# Patient Record
Sex: Male | Born: 1965 | Race: White | Hispanic: No | Marital: Married | Smoking: Current every day smoker
Health system: Southern US, Community
[De-identification: ages and names within clinical notes are randomized; demographics above are authoritative.]

## PROBLEM LIST (undated history)

## (undated) DIAGNOSIS — I2510 Atherosclerotic heart disease of native coronary artery without angina pectoris: Secondary | ICD-10-CM

## (undated) DIAGNOSIS — Z87442 Personal history of urinary calculi: Secondary | ICD-10-CM

## (undated) DIAGNOSIS — E785 Hyperlipidemia, unspecified: Secondary | ICD-10-CM

## (undated) DIAGNOSIS — F1410 Cocaine abuse, uncomplicated: Secondary | ICD-10-CM

## (undated) DIAGNOSIS — N289 Disorder of kidney and ureter, unspecified: Secondary | ICD-10-CM

## (undated) DIAGNOSIS — M1990 Unspecified osteoarthritis, unspecified site: Secondary | ICD-10-CM

## (undated) DIAGNOSIS — F17200 Nicotine dependence, unspecified, uncomplicated: Secondary | ICD-10-CM

## (undated) DIAGNOSIS — I214 Non-ST elevation (NSTEMI) myocardial infarction: Secondary | ICD-10-CM

## (undated) HISTORY — PX: HERNIA REPAIR: SHX51

## (undated) HISTORY — PX: CORONARY ANGIOPLASTY WITH STENT PLACEMENT: SHX49

---

## 2017-11-17 ENCOUNTER — Emergency Department (HOSPITAL_COMMUNITY): Payer: Self-pay

## 2017-11-17 ENCOUNTER — Encounter (HOSPITAL_COMMUNITY): Payer: Self-pay

## 2017-11-17 ENCOUNTER — Inpatient Hospital Stay (HOSPITAL_COMMUNITY)
Admission: EM | Admit: 2017-11-17 | Discharge: 2017-11-20 | DRG: 282 | Disposition: A | Discharge status: Home / Self Care | Payer: Self-pay | Attending: Cardiology | Admitting: Cardiology

## 2017-11-17 ENCOUNTER — Other Ambulatory Visit: Payer: Self-pay

## 2017-11-17 DIAGNOSIS — Z7151 Drug abuse counseling and surveillance of drug abuser: Secondary | ICD-10-CM

## 2017-11-17 DIAGNOSIS — Z955 Presence of coronary angioplasty implant and graft: Secondary | ICD-10-CM

## 2017-11-17 DIAGNOSIS — I214 Non-ST elevation (NSTEMI) myocardial infarction: Secondary | ICD-10-CM

## 2017-11-17 DIAGNOSIS — I10 Essential (primary) hypertension: Secondary | ICD-10-CM | POA: Diagnosis present

## 2017-11-17 DIAGNOSIS — I25110 Atherosclerotic heart disease of native coronary artery with unstable angina pectoris: Secondary | ICD-10-CM | POA: Diagnosis present

## 2017-11-17 DIAGNOSIS — F17200 Nicotine dependence, unspecified, uncomplicated: Secondary | ICD-10-CM | POA: Diagnosis present

## 2017-11-17 DIAGNOSIS — F1410 Cocaine abuse, uncomplicated: Secondary | ICD-10-CM | POA: Diagnosis present

## 2017-11-17 DIAGNOSIS — Z8249 Family history of ischemic heart disease and other diseases of the circulatory system: Secondary | ICD-10-CM

## 2017-11-17 DIAGNOSIS — E785 Hyperlipidemia, unspecified: Secondary | ICD-10-CM | POA: Diagnosis present

## 2017-11-17 DIAGNOSIS — I200 Unstable angina: Secondary | ICD-10-CM

## 2017-11-17 DIAGNOSIS — F17210 Nicotine dependence, cigarettes, uncomplicated: Secondary | ICD-10-CM | POA: Diagnosis present

## 2017-11-17 HISTORY — DX: Nicotine dependence, unspecified, uncomplicated: F17.200

## 2017-11-17 HISTORY — DX: Atherosclerotic heart disease of native coronary artery without angina pectoris: I25.10

## 2017-11-17 HISTORY — DX: Cocaine abuse, uncomplicated: F14.10

## 2017-11-17 HISTORY — DX: Hyperlipidemia, unspecified: E78.5

## 2017-11-17 HISTORY — DX: Non-ST elevation (NSTEMI) myocardial infarction: I21.4

## 2017-11-17 LAB — BASIC METABOLIC PANEL
ANION GAP: 12 (ref 5–15)
BUN: 15 mg/dL (ref 6–20)
CALCIUM: 9.4 mg/dL (ref 8.9–10.3)
CO2: 22 mmol/L (ref 22–32)
CREATININE: 0.99 mg/dL (ref 0.61–1.24)
Chloride: 108 mmol/L (ref 101–111)
Glucose, Bld: 104 mg/dL — ABNORMAL HIGH (ref 65–99)
Potassium: 3.6 mmol/L (ref 3.5–5.1)
Sodium: 142 mmol/L (ref 135–145)

## 2017-11-17 LAB — HEPATIC FUNCTION PANEL
ALBUMIN: 4.4 g/dL (ref 3.5–5.0)
ALK PHOS: 70 U/L (ref 38–126)
ALT: 35 U/L (ref 17–63)
AST: 31 U/L (ref 15–41)
BILIRUBIN INDIRECT: 0.8 mg/dL (ref 0.3–0.9)
Bilirubin, Direct: 0.2 mg/dL (ref 0.1–0.5)
TOTAL PROTEIN: 7.2 g/dL (ref 6.5–8.1)
Total Bilirubin: 1 mg/dL (ref 0.3–1.2)

## 2017-11-17 LAB — CBC
HCT: 48 % (ref 39.0–52.0)
HEMOGLOBIN: 16.3 g/dL (ref 13.0–17.0)
MCH: 31.2 pg (ref 26.0–34.0)
MCHC: 34 g/dL (ref 30.0–36.0)
MCV: 91.8 fL (ref 78.0–100.0)
PLATELETS: 242 10*3/uL (ref 150–400)
RBC: 5.23 MIL/uL (ref 4.22–5.81)
RDW: 13.7 % (ref 11.5–15.5)
WBC: 11.1 10*3/uL — ABNORMAL HIGH (ref 4.0–10.5)

## 2017-11-17 LAB — DIFFERENTIAL
BASOS PCT: 0 %
Basophils Absolute: 0 10*3/uL (ref 0.0–0.1)
EOS PCT: 0 %
Eosinophils Absolute: 0 10*3/uL (ref 0.0–0.7)
LYMPHS PCT: 21 %
Lymphs Abs: 2.2 10*3/uL (ref 0.7–4.0)
Monocytes Absolute: 1.1 10*3/uL — ABNORMAL HIGH (ref 0.1–1.0)
Monocytes Relative: 11 %
NEUTROS ABS: 7.4 10*3/uL (ref 1.7–7.7)
NEUTROS PCT: 68 %

## 2017-11-17 LAB — TROPONIN I: TROPONIN I: 0.07 ng/mL — AB (ref <0.03)

## 2017-11-17 MED ORDER — ASPIRIN 325 MG PO TABS
325.0000 mg | ORAL_TABLET | Freq: Once | ORAL | Status: AC
  Administered 2017-11-17: 325 mg via ORAL
  Filled 2017-11-17: qty 1

## 2017-11-17 MED ORDER — MORPHINE SULFATE (PF) 4 MG/ML IV SOLN
4.0000 mg | Freq: Once | INTRAVENOUS | Status: AC
  Administered 2017-11-17: 4 mg via INTRAVENOUS
  Filled 2017-11-17: qty 1

## 2017-11-17 MED ORDER — HEPARIN (PORCINE) IN NACL 100-0.45 UNIT/ML-% IJ SOLN
1800.0000 [IU]/h | INTRAMUSCULAR | Status: DC
  Administered 2017-11-17: 1000 [IU]/h via INTRAVENOUS
  Administered 2017-11-18: 1300 [IU]/h via INTRAVENOUS
  Administered 2017-11-19: 1600 [IU]/h via INTRAVENOUS
  Filled 2017-11-17 (×4): qty 250

## 2017-11-17 MED ORDER — HEPARIN BOLUS VIA INFUSION
4000.0000 [IU] | Freq: Once | INTRAVENOUS | Status: AC
  Administered 2017-11-17: 4000 [IU] via INTRAVENOUS

## 2017-11-17 MED ORDER — NITROGLYCERIN 0.4 MG SL SUBL
0.4000 mg | SUBLINGUAL_TABLET | Freq: Once | SUBLINGUAL | Status: AC
  Administered 2017-11-17: 0.4 mg via SUBLINGUAL

## 2017-11-17 MED ORDER — MORPHINE SULFATE (PF) 4 MG/ML IV SOLN
4.0000 mg | Freq: Once | INTRAVENOUS | Status: AC
  Administered 2017-11-18: 4 mg via INTRAVENOUS
  Filled 2017-11-17: qty 1

## 2017-11-17 MED ORDER — NITROGLYCERIN 0.4 MG SL SUBL
SUBLINGUAL_TABLET | SUBLINGUAL | Status: AC
  Administered 2017-11-17: 0.4 mg via SUBLINGUAL
  Filled 2017-11-17: qty 1

## 2017-11-17 MED ORDER — LORAZEPAM 2 MG/ML IJ SOLN
1.0000 mg | Freq: Once | INTRAMUSCULAR | Status: AC
  Administered 2017-11-17: 1 mg via INTRAVENOUS
  Filled 2017-11-17: qty 1

## 2017-11-17 MED ORDER — NITROGLYCERIN 2 % TD OINT
1.0000 [in_us] | TOPICAL_OINTMENT | Freq: Once | TRANSDERMAL | Status: AC
  Administered 2017-11-18: 1 [in_us] via TOPICAL
  Filled 2017-11-17: qty 1

## 2017-11-17 NOTE — ED Notes (Signed)
2nd Nitro 0.4 sl given at this time.

## 2017-11-17 NOTE — ED Notes (Signed)
Christopher [name], [phone] pt gives permission to discuss care with wife

## 2017-11-17 NOTE — ED Triage Notes (Signed)
EMS called out to pt home for CP. Pt reports that he has smoked $300 worth of crack today. Last used 45 minutes prior to arrival. Pt reports CP started about an hour ago. Pt reports nausea and feeling "shaky". Pt alert and oriented x 4, Cooperative, non-diaphoretic.

## 2017-11-17 NOTE — H&P (Signed)
CARDIOLOGY H&P  HPI: Christopher [name] is a 51 y.o. male w/ a history of CAD (s/p PCI in [place] in his 20's per patient report) and substance abuse presenting with chest pain.   Patient used crack cocaine around 8pm the night of presentation. Shortly thereafter he developed left sided chest pressure. Felt like a "squeezing" sensation. Non radiating. + diaphoretic. Had not used cocaine in several years, however when he had used prior he had never had symptoms like this. No aggravating or alleviating factors. Came to ED for further workup.    Patient takes no medications. He smokes 1 pack per day. No alcohol. Uses cocaine but no other drugs. Works as a Curator. Father had CAD and CABG.   Chest pain eventually resolved in [facility] ED after several doses of SLN. Transferred to [facility] and had several more transient, self-resolving episodes of chest pain overnight.   Notably the patient believes that he underwent a heart cath when he was in his 20's and ended up having a stent placed. He does not recall the details of this. He was in [place] at the time. He remembers his doctors telling him to take aspirin every day, which he did for several years but then discontinued.   Review of Systems:     Cardiac Review of Systems: {Y] = yes [ ]  = no  Chest Pain [ Y   ]  Resting SOB [   ] Exertional SOB  [  ]  Orthopnea [  ]   Pedal Edema [   ]    Palpitations [  ] Syncope  [  ]   Presyncope [   ]  General Review of Systems: [Y] = yes [  ]=no Constitional: recent weight change [  ]; anorexia [  ]; fatigue [  ]; nausea [  ]; night sweats [  ]; fever [  ]; or chills [  ];                                                                     Dental: poor dentition[  ];   Eye : blurred vision [  ]; diplopia [   ]; vision changes [  ];  Amaurosis fugax[  ]; Resp: cough [  ];  wheezing[  ];  hemoptysis[  ]; shortness of breath[  ]; paroxysmal nocturnal dyspnea[  ]; dyspnea on exertion[  ]; or orthopnea[  ];  GI:  gallstones[   ], vomiting[  ];  dysphagia[  ]; melena[  ];  hematochezia [  ]; heartburn[  ];   GU: kidney stones [  ]; hematuria[  ];   dysuria [  ];  nocturia[  ];               Skin: rash [  ], swelling[  ];, hair loss[  ];  peripheral edema[  ];  or itching[  ]; Musculosketetal: myalgias[  ];  joint swelling[  ];  joint erythema[  ];  joint pain[  ];  back pain[  ];  Heme/Lymph: bruising[  ];  bleeding[  ];  anemia[  ];  Neuro: TIA[  ];  headaches[  ];  stroke[  ];  vertigo[  ];  seizures[  ];  paresthesias[  ];  difficulty walking[  ];  Psych:depression[  ]; anxiety[  ];  Endocrine: diabetes[  ];  thyroid dysfunction[  ];  Other:  Past Medical History:  Diagnosis Date  . CAD (coronary artery disease)     Prior to Admission medications   Not on File  - no home medications  No Known Allergies  Social History   Socioeconomic History  . Marital status: Married    Spouse name: Not on file  . Number of children: Not on file  . Years of education: Not on file  . Highest education level: Not on file  Social Needs  . Financial resource strain: Not on file  . Food insecurity - worry: Not on file  . Food insecurity - inability: Not on file  . Transportation needs - medical: Not on file  . Transportation needs - non-medical: Not on file  Occupational History  . Not on file  Tobacco Use  . Smoking status: Current Every Day Smoker    Packs/day: 1.00    Types: Cigarettes  . Smokeless tobacco: Never Used  Substance and Sexual Activity  . Alcohol use: No    Frequency: Never  . Drug use: Yes    Frequency: 1.0 times per week    Types: Cocaine    Comment: "first time used again in years" per pt  . Sexual activity: Not on file  Other Topics Concern  . Not on file  Social History Narrative  . Not on file    History reviewed. No pertinent family history.  PHYSICAL EXAM: Vitals:   11/18/17 0228 11/18/17 0345  BP: 91/61 108/73  Pulse:    Resp: 19 16  Temp:    SpO2:     General:   Well appearing. No respiratory difficulty HEENT: normal Neck: supple. no JVD. Carotids 2+ bilat; no bruits. No lymphadenopathy or thryomegaly appreciated. Cor: PMI nondisplaced. Regular rate & rhythm. 2/6 HSM at 5th interspace mid-clavicular line. Lungs: scattered wheezing throughout Abdomen: soft, nontender, nondistended. No hepatosplenomegaly. No bruits or masses. Good bowel sounds. Extremities: no cyanosis, clubbing, rash, edema Neuro: alert & oriented x 3, cranial nerves grossly intact. moves all 4 extremities w/o difficulty. Affect pleasant.  ECG: NSR, no ST/T wave changes  Results for orders placed or performed during the hospital encounter of 11/17/17 (from the past 24 hour(s))  Basic metabolic panel     Status: Abnormal   Collection Time: 11/17/17  9:09 PM  Result Value Ref Range   Sodium 142 135 - 145 mmol/L   Potassium 3.6 3.5 - 5.1 mmol/L   Chloride 108 101 - 111 mmol/L   CO2 22 22 - 32 mmol/L   Glucose, Bld 104 (H) 65 - 99 mg/dL   BUN 15 6 - 20 mg/dL   Creatinine, Ser 4.09 0.61 - 1.24 mg/dL   Calcium 9.4 8.9 - 81.1 mg/dL   GFR calc non Af Amer >60 >60 mL/min   GFR calc Af Amer >60 >60 mL/min   Anion gap 12 5 - 15  CBC     Status: Abnormal   Collection Time: 11/17/17  9:09 PM  Result Value Ref Range   WBC 11.1 (H) 4.0 - 10.5 K/uL   RBC 5.23 4.22 - 5.81 MIL/uL   Hemoglobin 16.3 13.0 - 17.0 g/dL   HCT 91.4 78.2 - 95.6 %   MCV 91.8 78.0 - 100.0 fL   MCH 31.2 26.0 - 34.0 pg   MCHC 34.0 30.0 - 36.0 g/dL  RDW 13.7 11.5 - 15.5 %   Platelets 242 150 - 400 K/uL  Troponin I     Status: Abnormal   Collection Time: 11/17/17  9:09 PM  Result Value Ref Range   Troponin I 0.07 (HH) <0.03 ng/mL  Differential     Status: Abnormal   Collection Time: 11/17/17  9:10 PM  Result Value Ref Range   Neutrophils Relative % 68 %   Neutro Abs 7.4 1.7 - 7.7 K/uL   Lymphocytes Relative 21 %   Lymphs Abs 2.2 0.7 - 4.0 K/uL   Monocytes Relative 11 %   Monocytes Absolute 1.1 (H) 0.1 -  1.0 K/uL   Eosinophils Relative 0 %   Eosinophils Absolute 0.0 0.0 - 0.7 K/uL   Basophils Relative 0 %   Basophils Absolute 0.0 0.0 - 0.1 K/uL  Hepatic function panel     Status: None   Collection Time: 11/17/17  9:10 PM  Result Value Ref Range   Total Protein 7.2 6.5 - 8.1 g/dL   Albumin 4.4 3.5 - 5.0 g/dL   AST 31 15 - 41 U/L   ALT 35 17 - 63 U/L   Alkaline Phosphatase 70 38 - 126 U/L   Total Bilirubin 1.0 0.3 - 1.2 mg/dL   Bilirubin, Direct 0.2 0.1 - 0.5 mg/dL   Indirect Bilirubin 0.8 0.3 - 0.9 mg/dL  Troponin I     Status: Abnormal   Collection Time: 11/18/17 12:01 AM  Result Value Ref Range   Troponin I 0.06 (HH) <0.03 ng/mL  Troponin I     Status: Abnormal   Collection Time: 11/18/17  2:19 AM  Result Value Ref Range   Troponin I 0.04 (HH) <0.03 ng/mL  Hemoglobin A1c     Status: None   Collection Time: 11/18/17  2:19 AM  Result Value Ref Range   Hgb A1c MFr Bld 5.4 4.8 - 5.6 %   Mean Plasma Glucose 108.28 mg/dL  Brain natriuretic peptide     Status: None   Collection Time: 11/18/17  2:19 AM  Result Value Ref Range   B Natriuretic Peptide 34.4 0.0 - 100.0 pg/mL  Basic metabolic panel     Status: Abnormal   Collection Time: 11/18/17  2:19 AM  Result Value Ref Range   Sodium 140 135 - 145 mmol/L   Potassium 3.5 3.5 - 5.1 mmol/L   Chloride 106 101 - 111 mmol/L   CO2 24 22 - 32 mmol/L   Glucose, Bld 103 (H) 65 - 99 mg/dL   BUN 14 6 - 20 mg/dL   Creatinine, Ser 1.611.11 0.61 - 1.24 mg/dL   Calcium 9.0 8.9 - 09.610.3 mg/dL   GFR calc non Af Amer >60 >60 mL/min   GFR calc Af Amer >60 >60 mL/min   Anion gap 10 5 - 15  Lipid panel     Status: Abnormal   Collection Time: 11/18/17  2:19 AM  Result Value Ref Range   Cholesterol 194 0 - 200 mg/dL   Triglycerides 61 <045<150 mg/dL   HDL 36 (L) >40>40 mg/dL   Total CHOL/HDL Ratio 5.4 RATIO   VLDL 12 0 - 40 mg/dL   LDL Cholesterol 981146 (H) 0 - 99 mg/dL  CBC     Status: None   Collection Time: 11/18/17  2:19 AM  Result Value Ref Range    WBC 8.2 4.0 - 10.5 K/uL   RBC 5.03 4.22 - 5.81 MIL/uL   Hemoglobin 15.7 13.0 - 17.0 g/dL  HCT 46.0 39.0 - 52.0 %   MCV 91.5 78.0 - 100.0 fL   MCH 31.2 26.0 - 34.0 pg   MCHC 34.1 30.0 - 36.0 g/dL   RDW 62.913.8 52.811.5 - 41.315.5 %   Platelets 245 150 - 400 K/uL   Dg Chest 2 View  Result Date: 11/17/2017 CLINICAL DATA:  Generalized chest pain with nausea EXAM: CHEST  2 VIEW COMPARISON:  None. FINDINGS: Minimal atelectasis at the left base. No acute consolidation or effusion. Normal cardiomediastinal silhouette. No pneumothorax. IMPRESSION: No active cardiopulmonary disease. Minimal atelectasis at the left lung base. Electronically Signed   By: Jasmine PangKim  Fujinaga M.D.   On: 11/17/2017 21:42     ASSESSMENT: Richard MiuRobert Speranza is a 51 y.o. male w/ a history of CAD (s/p PCI in [place] in his 20's per patient report) and substance abuse presenting with chest pain, found to have NSTEMI.   PLAN/DISCUSSION:  #) NSTEMI: patient at risk for atherosclerotic CAD w/ questionable history of known CAD s/p stenting. NSTEMI may also be spasm mediated in the setting of cocaine use. Has not been hypertensive here making hypertensive emergency less likely. - repeat troponin q6h x 2 - check lipids, A1c - TTE in AM - ASA 324mg  then 81mg  daily - heparin drip for ACS per pharmacy protocol - atorvastatin 80mg  QHS - start carvedilol 6.25mg  BID - SLN, nitro gtt PRN - defer P2Y12 for now, though would likely benefit from this in setting of known CAD w/ prior stenting  #) HLD - atorvastatin as per above  #) HTN:  - carvedilol as per above  #) Substance abuse:  - substance abuse counseling  Rosario JacksAnthony Philip Zigmund Linse, MD Cardiology Fellow, PGY-5

## 2017-11-18 ENCOUNTER — Inpatient Hospital Stay (HOSPITAL_COMMUNITY): Payer: Self-pay

## 2017-11-18 DIAGNOSIS — R079 Chest pain, unspecified: Secondary | ICD-10-CM

## 2017-11-18 LAB — HEPARIN LEVEL (UNFRACTIONATED)
HEPARIN UNFRACTIONATED: 0.16 [IU]/mL — AB (ref 0.30–0.70)
HEPARIN UNFRACTIONATED: 0.41 [IU]/mL (ref 0.30–0.70)
Heparin Unfractionated: <0.1 IU/mL — ABNORMAL LOW (ref 0.30–0.70)

## 2017-11-18 LAB — ECHOCARDIOGRAM COMPLETE
CHL CUP DOP CALC LVOT VTI: 24.5 cm
CHL CUP MV DEC (S): 257
CHL CUP STROKE VOLUME: 63 mL
E decel time: 257 msec
E/e' ratio: 6.79
FS: 43 % (ref 28–44)
Height: 69 in
IV/PV OW: 0.84
LA vol: 59.2 mL
LADIAMINDEX: 1.78 cm/m2
LASIZE: 41 mm
LAVOLA4C: 51.1 mL
LAVOLIN: 25.8 mL/m2
LEFT ATRIUM END SYS DIAM: 41 mm
LV TDI E'LATERAL: 11.5
LV TDI E'MEDIAL: 9.36
LV dias vol index: 53 mL/m2
LV dias vol: 122 mL (ref 62–150)
LV sys vol index: 26 mL/m2
LV sys vol: 59 mL (ref 21–61)
LVEEAVG: 6.79
LVEEMED: 6.79
LVELAT: 11.5 cm/s
LVOT area: 3.14 cm2
LVOT diameter: 20 mm
LVOT peak grad rest: 6 mmHg
LVOTPV: 127 cm/s
LVOTSV: 77 mL
Lateral S' vel: 12.6 cm/s
MVPG: 2 mmHg
MVPKAVEL: 64 m/s
MVPKEVEL: 78.1 m/s
PW: 9.24 mm — AB (ref 0.6–1.1)
Simpson's disk: 51
TAPSE: 19.8 mm
Weight: 3700.8 oz

## 2017-11-18 LAB — BASIC METABOLIC PANEL
ANION GAP: 10 (ref 5–15)
BUN: 14 mg/dL (ref 6–20)
CALCIUM: 9 mg/dL (ref 8.9–10.3)
CO2: 24 mmol/L (ref 22–32)
CREATININE: 1.11 mg/dL (ref 0.61–1.24)
Chloride: 106 mmol/L (ref 101–111)
GFR calc Af Amer: >60 mL/min (ref >60)
GLUCOSE: 103 mg/dL — AB (ref 65–99)
Potassium: 3.5 mmol/L (ref 3.5–5.1)
Sodium: 140 mmol/L (ref 135–145)

## 2017-11-18 LAB — TROPONIN I
TROPONIN I: 0.06 ng/mL — AB (ref <0.03)
Troponin I: 0.04 ng/mL (ref <0.03)

## 2017-11-18 LAB — LIPID PANEL
Cholesterol: 194 mg/dL (ref 0–200)
HDL: 36 mg/dL — AB (ref >40)
LDL Cholesterol: 146 mg/dL — ABNORMAL HIGH (ref 0–99)
Total CHOL/HDL Ratio: 5.4 RATIO
Triglycerides: 61 mg/dL (ref <150)
VLDL: 12 mg/dL (ref 0–40)

## 2017-11-18 LAB — CBC
HEMATOCRIT: 46 % (ref 39.0–52.0)
HEMOGLOBIN: 15.7 g/dL (ref 13.0–17.0)
MCH: 31.2 pg (ref 26.0–34.0)
MCHC: 34.1 g/dL (ref 30.0–36.0)
MCV: 91.5 fL (ref 78.0–100.0)
Platelets: 245 10*3/uL (ref 150–400)
RBC: 5.03 MIL/uL (ref 4.22–5.81)
RDW: 13.8 % (ref 11.5–15.5)
WBC: 8.2 10*3/uL (ref 4.0–10.5)

## 2017-11-18 LAB — BRAIN NATRIURETIC PEPTIDE: B NATRIURETIC PEPTIDE 5: 34.4 pg/mL (ref 0.0–100.0)

## 2017-11-18 LAB — HEMOGLOBIN A1C
HEMOGLOBIN A1C: 5.4 % (ref 4.8–5.6)
MEAN PLASMA GLUCOSE: 108.28 mg/dL

## 2017-11-18 LAB — HIV ANTIBODY (ROUTINE TESTING W REFLEX): HIV SCREEN 4TH GENERATION: NONREACTIVE

## 2017-11-18 LAB — MRSA PCR SCREENING: MRSA BY PCR: NEGATIVE

## 2017-11-18 MED ORDER — HEPARIN BOLUS VIA INFUSION
3000.0000 [IU] | Freq: Once | INTRAVENOUS | Status: AC
  Administered 2017-11-18: 3000 [IU] via INTRAVENOUS
  Filled 2017-11-18: qty 3000

## 2017-11-18 MED ORDER — ACETAMINOPHEN 325 MG PO TABS: 650.0000 mg | ORAL_TABLET | ORAL | Status: DC | PRN

## 2017-11-18 MED ORDER — ASPIRIN EC 81 MG PO TBEC
81.0000 mg | DELAYED_RELEASE_TABLET | Freq: Every day | ORAL | Status: DC
  Administered 2017-11-18 – 2017-11-20 (×3): 81 mg via ORAL
  Filled 2017-11-18 (×3): qty 1

## 2017-11-18 MED ORDER — NITROGLYCERIN IN D5W 200-5 MCG/ML-% IV SOLN
0.0000 ug/min | INTRAVENOUS | Status: DC
  Filled 2017-11-18: qty 250

## 2017-11-18 MED ORDER — ONDANSETRON HCL 4 MG/2ML IJ SOLN
4.0000 mg | Freq: Once | INTRAMUSCULAR | Status: AC
  Administered 2017-11-18: 4 mg via INTRAVENOUS

## 2017-11-18 MED ORDER — NITROGLYCERIN 0.4 MG SL SUBL
0.4000 mg | SUBLINGUAL_TABLET | SUBLINGUAL | Status: DC | PRN
  Administered 2017-11-19 (×2): 0.4 mg via SUBLINGUAL
  Filled 2017-11-18: qty 1

## 2017-11-18 MED ORDER — ONDANSETRON HCL 4 MG/2ML IJ SOLN
INTRAMUSCULAR | Status: AC
  Filled 2017-11-18: qty 2

## 2017-11-18 MED ORDER — ASPIRIN 81 MG PO CHEW: 324.0000 mg | CHEWABLE_TABLET | ORAL | Status: DC

## 2017-11-18 MED ORDER — CARVEDILOL 6.25 MG PO TABS
6.2500 mg | ORAL_TABLET | Freq: Two times a day (BID) | ORAL | Status: DC
  Administered 2017-11-18 (×2): 6.25 mg via ORAL
  Filled 2017-11-18 (×2): qty 1

## 2017-11-18 MED ORDER — ONDANSETRON HCL 4 MG/2ML IJ SOLN: 4.0000 mg | Freq: Four times a day (QID) | INTRAMUSCULAR | Status: DC | PRN

## 2017-11-18 MED ORDER — ASPIRIN 300 MG RE SUPP: 300.0000 mg | RECTAL | Status: DC

## 2017-11-18 MED ORDER — ATORVASTATIN CALCIUM 80 MG PO TABS
80.0000 mg | ORAL_TABLET | Freq: Every day | ORAL | Status: DC
  Administered 2017-11-18 – 2017-11-19 (×3): 80 mg via ORAL
  Filled 2017-11-18 (×3): qty 1

## 2017-11-18 MED ORDER — HEPARIN BOLUS VIA INFUSION
2800.0000 [IU] | Freq: Once | INTRAVENOUS | Status: AC
  Administered 2017-11-18: 2800 [IU] via INTRAVENOUS
  Filled 2017-11-18: qty 2800

## 2017-11-18 MED ORDER — PNEUMOCOCCAL VAC POLYVALENT 25 MCG/0.5ML IJ INJ: 0.5000 mL | INJECTION | INTRAMUSCULAR | Status: DC

## 2017-11-18 NOTE — ED Notes (Signed)
Report to Lauralyn Primesavid, [facility]

## 2017-11-18 NOTE — Progress Notes (Signed)
ANTICOAGULATION CONSULT NOTE - Follow Up Consult  Pharmacy Consult for Heparin Indication: chest pain/ACS  No Known Allergies  Patient Measurements: Height: 5\' 9"  (175.3 cm) Weight: 231 lb 4.8 oz (104.9 kg) IBW/kg (Calculated) : 70.7 Heparin Dosing Weight: 94.5 kg  Vital Signs: Temp: 98.1 F (36.7 C) (12/22 1600) Temp Source: Oral (12/22 1600) BP: 104/61 (12/22 1600) Pulse Rate: 60 (12/22 1600)  Labs: Recent Labs    11/17/17 2109 11/18/17 0001 11/18/17 0219 11/18/17 0704 11/18/17 1532  HGB 16.3  --  15.7  --   --   HCT 48.0  --  46.0  --   --   PLT 242  --  245  --   --   HEPARINUNFRC  --   --   --  <0.10* 0.16*  CREATININE 0.99  --  1.11  --   --   TROPONINI 0.07* 0.06* 0.04*  --   --     Estimated Creatinine Clearance: 94 mL/min (by C-G formula based on SCr of 1.11 mg/dL).  Assessment: [name] is a [age] yoM with a chief complaint of chest pain, pharmacy consulted to dose heparin. Patient has a history of coronary artery disease with a stent. No bleeding noted and CBC wnl. Heparin level remains subtherapeutic at 0.16.   Goal of Therapy:  Heparin level 0.3-0.7 units/ml Monitor platelets by anticoagulation protocol: Yes   Plan:  Re-bolus heparin 3000 units IV x 1 Increase heparin gtt to 1600 units/hr Check a 6 hr heparin level Daily heparin level and CBC  Lysle Pearlachel Gwendolyn Nishi, PharmD, BCPS 11/18/2017 5:01 PM

## 2017-11-18 NOTE — Progress Notes (Signed)
ANTICOAGULATION CONSULT NOTE - Follow Up Consult  Pharmacy Consult for Heparin Indication: chest pain/ACS  No Known Allergies  Patient Measurements: Height: 5\' 9"  (175.3 cm) Weight: 231 lb 4.8 oz (104.9 kg) IBW/kg (Calculated) : 70.7  Vital Signs: Temp: 97.7 F (36.5 C) (12/22 1937) Temp Source: Oral (12/22 1937) BP: 114/65 (12/22 1937) Pulse Rate: 54 (12/22 1937)  Labs: Recent Labs    11/17/17 2109 11/18/17 0001 11/18/17 0219 11/18/17 0704 11/18/17 1532 11/18/17 2251  HGB 16.3  --  15.7  --   --   --   HCT 48.0  --  46.0  --   --   --   PLT 242  --  245  --   --   --   HEPARINUNFRC  --   --   --  <0.10* 0.16* 0.41  CREATININE 0.99  --  1.11  --   --   --   TROPONINI 0.07* 0.06* 0.04*  --   --   --     Estimated Creatinine Clearance: 94 mL/min (by C-G formula based on SCr of 1.11 mg/dL).  Assessment: [name] is a [age] yoM with a chief complaint of chest pain, pharmacy consulted to dose heparin. Patient has a history of coronary artery disease with a stent. No bleeding noted and CBC wnl. Heparin level therapeutic x 1 after rate increase   Goal of Therapy:  Heparin level 0.3-0.7 units/ml Monitor platelets by anticoagulation protocol: Yes   Plan:  Cont heparin at 1600 units/hr Confirmatory heparin level with AM labs  Christopher [name], PharmD, BCPS Clinical Pharmacist Phone: [phone]

## 2017-11-18 NOTE — Progress Notes (Signed)
CARDIAC REHAB PHASE I   PRE:  Rate/Rhythm:   BP:  Sitting:       SaO2:   MODE:  Ambulation: 0 ft   POST:  Rate/Rhythm:   BP:  Sitting:      SaO2:  1045-1140 Cardiac Rehab RN in to see patient. Requested education to be done first. Unsure of medical course of action for this patient as patient has not been seen by cardiologist since transfer. Primary RN stated there has been mention by nighttime RN staff of patients D/C later today. Education completed including review of MI booklet, diet sheet, activity progression, smoking cessation, and phase 2 cardiac rehab. Patient stated he works 2 jobs and has no time for rehab but is ok for referral to be placed for [facility]. Patient also has not insurance. He has to be employed with [employer] for 1 year before he is eligable for benefits. Post education Cardiac RN attempted to walk with patient but lunch tray came and patient requested to eat. Encouraged patient to ambulate with floor staff later this afternoon. Will follow up on Monday if patient does not go home.  Dhwani Venkatesh English PayneRN, BSN 11/18/2017 11:40 AM

## 2017-11-18 NOTE — ED Provider Notes (Signed)
[facility] EMERGENCY DEPARTMENT Provider Note   CSN: [id number] Arrival date & time: 11/17/17  2054     History   Chief Complaint Chief Complaint  Patient presents with  . Chest Pain    after smoking crack    HPI Christopher [name] is a 51 y.o. male.  Patient is complaining of chest discomfort after using cocaine.  Patient has a history of coronary disease with a stent   The history is provided by the patient.  Chest Pain   This is a new problem. The current episode started 1 to 2 hours ago. The problem occurs constantly. The problem has not changed since onset.The pain is present in the substernal region. The pain is at a severity of 7/10. The pain is moderate. The quality of the pain is described as exertional. The pain does not radiate. Pertinent negatives include no abdominal pain, no back pain, no cough and no headaches.  Pertinent negatives for past medical history include no seizures.    Past Medical History:  Diagnosis Date  . CAD (coronary artery disease)     Patient Active Problem List   Diagnosis Date Noted  . NSTEMI (non-ST elevated myocardial infarction) (HCC) 11/17/2017    Past Surgical History:  Procedure Laterality Date  . CORONARY ANGIOPLASTY WITH STENT PLACEMENT         Home Medications    Prior to Admission medications   Not on File    Family History History reviewed. No pertinent family history.  Social History Social History   Tobacco Use  . Smoking status: Current Every Day Smoker    Packs/day: 1.00    Types: Cigarettes  . Smokeless tobacco: Never Used  Substance Use Topics  . Alcohol use: No    Frequency: Never  . Drug use: Yes    Frequency: 1.0 times per week    Types: Cocaine    Comment: "first time used again in years" per pt     Allergies   Patient has no known allergies.   Review of Systems Review of Systems  Constitutional: Negative for appetite change and fatigue.  HENT: Negative for congestion, ear discharge  and sinus pressure.   Eyes: Negative for discharge.  Respiratory: Negative for cough.   Cardiovascular: Positive for chest pain.  Gastrointestinal: Negative for abdominal pain and diarrhea.  Genitourinary: Negative for frequency and hematuria.  Musculoskeletal: Negative for back pain.  Skin: Negative for rash.  Neurological: Negative for seizures and headaches.  Psychiatric/Behavioral: Negative for hallucinations.     Physical Exam Updated Vital Signs BP 122/69   Pulse 93   Resp (!) 24   Ht 5\' 9"  (1.753 m)   Wt 108.9 kg (240 lb)   SpO2 91%   BMI 35.44 kg/m   Physical Exam  Constitutional: He is oriented to person, place, and time. He appears well-developed.  HENT:  Head: Normocephalic.  Eyes: Conjunctivae and EOM are normal. No scleral icterus.  Neck: Neck supple. No thyromegaly present.  Cardiovascular: Normal rate and regular rhythm. Exam reveals no gallop and no friction rub.  No murmur heard. Pulmonary/Chest: No stridor. He has no wheezes. He has no rales. He exhibits no tenderness.  Abdominal: He exhibits no distension. There is no tenderness. There is no rebound.  Musculoskeletal: Normal range of motion. He exhibits no edema.  Lymphadenopathy:    He has no cervical adenopathy.  Neurological: He is oriented to person, place, and time. He exhibits normal muscle tone. Coordination normal.  Skin: No rash  noted. No erythema.  Psychiatric: He has a normal mood and affect. His behavior is normal.     ED Treatments / Results  Labs (all labs ordered are listed, but only abnormal results are displayed) Labs Reviewed  BASIC METABOLIC PANEL - Abnormal; Notable for the following components:      Result Value   Glucose, Bld 104 (*)    All other components within normal limits  CBC - Abnormal; Notable for the following components:   WBC 11.1 (*)    All other components within normal limits  TROPONIN I - Abnormal; Notable for the following components:   Troponin I 0.07  (*)    All other components within normal limits  DIFFERENTIAL - Abnormal; Notable for the following components:   Monocytes Absolute 1.1 (*)    All other components within normal limits  HEPATIC FUNCTION PANEL  TROPONIN I    EKG  EKG Interpretation  Date/Time:  Friday November 17 2017 20:58:04 EST Ventricular Rate:  94 PR Interval:    QRS Duration: 98 QT Interval:  380 QTC Calculation: 476 R Axis:   77 Text Interpretation:  Atrial fibrillation ST elev, probable normal early repol pattern Borderline prolonged QT interval Confirmed by Bethann BerkshireZammit, Miryah Ralls [phone]([id number]) on 11/17/2017 10:28:01 PM       Radiology Dg Chest 2 View  Result Date: 11/17/2017 CLINICAL DATA:  Generalized chest pain with nausea EXAM: CHEST  2 VIEW COMPARISON:  None. FINDINGS: Minimal atelectasis at the left base. No acute consolidation or effusion. Normal cardiomediastinal silhouette. No pneumothorax. IMPRESSION: No active cardiopulmonary disease. Minimal atelectasis at the left lung base. Electronically Signed   By: Jasmine PangKim  Fujinaga M.D.   On: 11/17/2017 21:42    Procedures Procedures (including critical care time)  Medications Ordered in ED Medications  heparin ADULT infusion 100 units/mL (25000 units/25250mL sodium chloride 0.45%) (1,000 Units/hr Intravenous New Bag/Given 11/17/17 2314)  morphine 4 MG/ML injection 4 mg (4 mg Intravenous Given 11/17/17 2133)  LORazepam (ATIVAN) injection 1 mg (1 mg Intravenous Given 11/17/17 2137)  aspirin tablet 325 mg (325 mg Oral Given 11/17/17 2132)  nitroGLYCERIN (NITROSTAT) SL tablet 0.4 mg (0.4 mg Sublingual Given 11/17/17 2237)  heparin bolus via infusion 4,000 Units (4,000 Units Intravenous Bolus from Bag 11/17/17 2315)  morphine 4 MG/ML injection 4 mg (4 mg Intravenous Given 11/18/17 0005)  nitroGLYCERIN (NITROGLYN) 2 % ointment 1 inch (1 inch Topical Given 11/18/17 0005)     Initial Impression / Assessment and Plan / ED Course  I have reviewed the triage vital signs  and the nursing notes.  Pertinent labs & imaging results that were available during my care of the patient were reviewed by me and considered in my medical decision making (see chart for details).     CRITICAL CARE Performed by: Bethann BerkshireJoseph Maurisa Tesmer Total critical care time: 45 minutes Critical care time was exclusive of separately billable procedures and treating other patients. Critical care was necessary to treat or prevent imminent or life-threatening deterioration. Critical care was time spent personally by me on the following activities: development of treatment plan with patient and/or surrogate as well as nursing, discussions with consultants, evaluation of patient's response to treatment, examination of patient, obtaining history from patient or surrogate, ordering and performing treatments and interventions, ordering and review of laboratory studies, ordering and review of radiographic studies, pulse oximetry and re-evaluation of patient's condition.  Patient with an end STEMI.  Patient has been using cocaine and has had chest pain pretty significant for a  number hours.  He will be admitted to stepdown unit over [facility]. Final Clinical Impressions(s) / ED Diagnoses   Final diagnoses:  Unstable angina [facility])    ED Discharge Orders    None       Bethann Berkshire, MD 11/18/17 [phone]

## 2017-11-18 NOTE — Progress Notes (Addendum)
ANTICOAGULATION CONSULT NOTE - Follow Up Consult  Pharmacy Consult for Heparin Indication: chest pain/ACS  No Known Allergies  Patient Measurements: Height: 5\' 9"  (175.3 cm) Weight: 231 lb 4.8 oz (104.9 kg) IBW/kg (Calculated) : 70.7 Heparin Dosing Weight: 94.5 kg  Vital Signs: Temp: 98.1 F (36.7 C) (12/22 0701) Temp Source: Oral (12/22 0701) BP: 95/74 (12/22 0701) Pulse Rate: 72 (12/22 0701)  Labs: Recent Labs    11/17/17 2109 11/18/17 0001 11/18/17 0219 11/18/17 0704  HGB 16.3  --  15.7  --   HCT 48.0  --  46.0  --   PLT 242  --  245  --   HEPARINUNFRC  --   --   --  <0.10*  CREATININE 0.99  --  1.11  --   TROPONINI 0.07* 0.06* 0.04*  --     Estimated Creatinine Clearance: 94 mL/min (by C-G formula based on SCr of 1.11 mg/dL).  Assessment: [name] is a [age] yoM with a chief complaint of chest pain, pharmacy consulted to dose heparin. Patient has a history of coronary artery disease with a stent. No bleeding noted. Baseline CBC wnl.  Heparin level this AM subtherapeutic: <0.10  Goal of Therapy:  Heparin level 0.3-0.7 units/ml Monitor platelets by anticoagulation protocol: Yes   Plan:  Heparin bolus 2,800 units x1 Increase heparin infusion to 1,300 units/hour Recheck heparin level 1600 Monitor daily CBC and heparin level, and for s/sx of bleeding   Christopher [name], PharmD, MS PGY1 Pharmacy Resident Pager: [phone]

## 2017-11-18 NOTE — ED Notes (Signed)
Pt off unit by [facility] for transport at this time

## 2017-11-18 NOTE — ED Notes (Signed)
Report to BarnestonMindy, [facility]

## 2017-11-18 NOTE — Progress Notes (Signed)
Received patient from [place], assessment completed, VS documented, oriented patient to the room.  CHG bath completed, MRSA swab sent, Will continue to monitor. 

## 2017-11-18 NOTE — Progress Notes (Signed)
  Echocardiogram 2D Echocardiogram has been performed.  Delcie RochENNINGTON, Lynnda Wiersma 11/18/2017, 1:19 PM

## 2017-11-19 DIAGNOSIS — F1410 Cocaine abuse, uncomplicated: Secondary | ICD-10-CM

## 2017-11-19 DIAGNOSIS — I214 Non-ST elevation (NSTEMI) myocardial infarction: Secondary | ICD-10-CM

## 2017-11-19 DIAGNOSIS — I208 Other forms of angina pectoris: Secondary | ICD-10-CM

## 2017-11-19 HISTORY — DX: Non-ST elevation (NSTEMI) myocardial infarction: I21.4

## 2017-11-19 LAB — CBC
HCT: 43.5 % (ref 39.0–52.0)
HEMOGLOBIN: 14.3 g/dL (ref 13.0–17.0)
MCH: 30.6 pg (ref 26.0–34.0)
MCHC: 32.9 g/dL (ref 30.0–36.0)
MCV: 93.1 fL (ref 78.0–100.0)
PLATELETS: 199 10*3/uL (ref 150–400)
RBC: 4.67 MIL/uL (ref 4.22–5.81)
RDW: 14 % (ref 11.5–15.5)
WBC: 6.4 10*3/uL (ref 4.0–10.5)

## 2017-11-19 LAB — HEPARIN LEVEL (UNFRACTIONATED): Heparin Unfractionated: 0.33 IU/mL (ref 0.30–0.70)

## 2017-11-19 MED ORDER — PNEUMOCOCCAL VAC POLYVALENT 25 MCG/0.5ML IJ INJ
0.5000 mL | INJECTION | INTRAMUSCULAR | Status: DC | PRN
Start: 2017-11-19 — End: 2017-11-20

## 2017-11-19 MED ORDER — CARVEDILOL 3.125 MG PO TABS
3.1250 mg | ORAL_TABLET | Freq: Two times a day (BID) | ORAL | Status: DC
  Filled 2017-11-19 (×2): qty 1

## 2017-11-19 NOTE — Progress Notes (Signed)
Patient stated he was having tightness and pain in chest 7 out of 10. Did EKG and gave patient nitro sub L. EKG showed NSR heart rate in 60's, waited 5 min and patient stated pain was 4 out of 10, gave 2nd nitro sub L and waited 5 min. Patient stated he had no more pain and tightness in his chest. Patient is resting comfortably in bed. VS are stable.

## 2017-11-19 NOTE — Progress Notes (Signed)
Pt hr sustaining 48-55 sinus and has dropped twice to 39  With 2 second pause Bp 104/69 paged Dr. Irwin Brakemanelvineni who ordered to hold next dose of coreg

## 2017-11-19 NOTE — Consult Note (Signed)
  Reason for Consult:chest pain   Referring Physician: ER  Christopher MiuRobert [name] is an 51 y.o. male.   HPI: Christopher [name] is a 51 yo man who presented to the ED with chest pain in the setting of cocaine use. He has ruled out for MI. His ECG's demonstrate no myocardial injury. He has ongoing pain. He has a long h/o substance abuse but stopped using cocaine several years ago but then relapsed a few weeks ago (his report) when he changed jobs and those at work were using and encouraged him to start back. He denies sob. He has a remote h/o CAD with stenting. He also smokes cigarettes. No fever or chills.    PMH: Past Medical History:  Diagnosis Date  . CAD (coronary artery disease)     PSHX: Past Surgical History:  Procedure Laterality Date  . CORONARY ANGIOPLASTY WITH STENT PLACEMENT      FAMHX:no premature CAD Social History:  reports that he has been smoking cigarettes.  He has been smoking about 1.00 pack per day. he has never used smokeless tobacco. He reports that he uses drugs. Drug: Cocaine. Frequency: 1.00 time per week. He reports that he does not drink alcohol.  Allergies: No Known Allergies  Medications: reviewed  Dg Chest 2 View  Result Date: 11/17/2017 CLINICAL DATA:  Generalized chest pain with nausea EXAM: CHEST  2 VIEW COMPARISON:  None. FINDINGS: Minimal atelectasis at the left base. No acute consolidation or effusion. Normal cardiomediastinal silhouette. No pneumothorax. IMPRESSION: No active cardiopulmonary disease. Minimal atelectasis at the left lung base. Electronically Signed   By: Jasmine PangKim  Fujinaga M.D.   On: 11/17/2017 21:42    ROS  As stated in the HPI and negative for all other systems.  Physical Exam  Vitals:Blood pressure 102/63, pulse (!) 50, temperature 97.8 F (36.6 C), temperature source Oral, resp. rate 20, height 5\' 9"  (1.753 m), weight 231 lb 4.8 oz (104.9 kg), SpO2 93 %.  diskempt appearing NAD HEENT: Unremarkable Neck:  6 cm JVD, no  thyromegally Lymphatics:  No adenopathy Back:  No CVA tenderness Lungs:  Clear with no wheezes or rales HEART:  Regular rate rhythm, no murmurs, no rubs, no clicks Abd:  soft, positive bowel sounds, no organomegally, no rebound, no guarding Ext:  2 plus pulses, no edema, no cyanosis, no clubbing Skin:  No rashes no nodules Neuro:  CN II through XII intact, motor grossly intact  Labs - reviewed CXR - no acute changes.  ECG - NSR with no STT changes  Assessment/Plan: 1. Chest pain - no objective ischemia. Will continue with nitrates and control his blood pressure. No plans for additional testing in the absence of ischemia.  2. Cocaine abuse - unclear why there is no tox screen except that the patient admits to using cocaine. He has been counseled to stop this dangerous behavior.   Sharlot GowdaGregg TaylorMD 11/19/2017, 9:08 AM

## 2017-11-19 NOTE — Progress Notes (Signed)
ANTICOAGULATION CONSULT NOTE - Follow Up Consult  Pharmacy Consult for Heparin Indication: chest pain/ACS  No Known Allergies  Patient Measurements: Height: 5\' 9"  (175.3 cm) Weight: 231 lb 4.8 oz (104.9 kg) IBW/kg (Calculated) : 70.7  Vital Signs: Temp: 97.8 F (36.6 C) (12/23 0305) Temp Source: Oral (12/23 0305) BP: 102/63 (12/22 2352) Pulse Rate: 50 (12/22 2352)  Labs: Recent Labs    11/17/17 2109 11/18/17 0001 11/18/17 0219  11/18/17 1532 11/18/17 2251 11/19/17 0204  HGB 16.3  --  15.7  --   --   --  14.3  HCT 48.0  --  46.0  --   --   --  43.5  PLT 242  --  245  --   --   --  199  HEPARINUNFRC  --   --   --    < > 0.16* 0.41 0.33  CREATININE 0.99  --  1.11  --   --   --   --   TROPONINI 0.07* 0.06* 0.04*  --   --   --   --    < > = values in this interval not displayed.    Estimated Creatinine Clearance: 94 mL/min (by C-G formula based on SCr of 1.11 mg/dL).  Assessment: [name] is a [age] yoM with a chief complaint of chest pain, pharmacy consulted to dose heparin. No anticoagulation PTA. Patient has a history of coronary artery disease with a stent. No bleeding noted and CBC wnl. Heparin level therapeutic.  Goal of Therapy:  Heparin level 0.3-0.7 units/ml Monitor platelets by anticoagulation protocol: Yes   Plan:  Continue heparin at 1600 units/hr Daily heparin level/CBC Monitor for s/sx bleeding   Christopher [name], PharmD, BCPS Clinical Pharmacist Clinical phone for 11/19/2017 until 3:30pm: [phone] If after 3:30pm, please call main pharmacy at: [phone] 11/19/2017 10:35 AM

## 2017-11-20 ENCOUNTER — Other Ambulatory Visit: Payer: Self-pay | Admitting: Cardiology

## 2017-11-20 ENCOUNTER — Encounter (HOSPITAL_COMMUNITY): Payer: Self-pay | Admitting: Cardiology

## 2017-11-20 DIAGNOSIS — F1410 Cocaine abuse, uncomplicated: Secondary | ICD-10-CM | POA: Diagnosis present

## 2017-11-20 DIAGNOSIS — I214 Non-ST elevation (NSTEMI) myocardial infarction: Principal | ICD-10-CM

## 2017-11-20 DIAGNOSIS — F17200 Nicotine dependence, unspecified, uncomplicated: Secondary | ICD-10-CM | POA: Diagnosis present

## 2017-11-20 DIAGNOSIS — E785 Hyperlipidemia, unspecified: Secondary | ICD-10-CM | POA: Diagnosis present

## 2017-11-20 LAB — CBC
HCT: 44.2 % (ref 39.0–52.0)
Hemoglobin: 14.8 g/dL (ref 13.0–17.0)
MCH: 31.1 pg (ref 26.0–34.0)
MCHC: 33.5 g/dL (ref 30.0–36.0)
MCV: 92.9 fL (ref 78.0–100.0)
PLATELETS: 189 10*3/uL (ref 150–400)
RBC: 4.76 MIL/uL (ref 4.22–5.81)
RDW: 13.6 % (ref 11.5–15.5)
WBC: 7.1 10*3/uL (ref 4.0–10.5)

## 2017-11-20 LAB — HEPARIN LEVEL (UNFRACTIONATED): HEPARIN UNFRACTIONATED: 0.22 [IU]/mL — AB (ref 0.30–0.70)

## 2017-11-20 MED ORDER — NITROGLYCERIN 0.4 MG SL SUBL: 0.4000 mg | SUBLINGUAL_TABLET | SUBLINGUAL | 2 refills | Status: DC | PRN

## 2017-11-20 MED ORDER — ACETAMINOPHEN 325 MG PO TABS: 650.0000 mg | ORAL_TABLET | ORAL | Status: DC | PRN

## 2017-11-20 MED ORDER — ASPIRIN 81 MG PO TBEC: 81.0000 mg | DELAYED_RELEASE_TABLET | Freq: Every day | ORAL | Status: DC

## 2017-11-20 MED ORDER — ATORVASTATIN CALCIUM 80 MG PO TABS: 80.0000 mg | ORAL_TABLET | Freq: Every day | ORAL | 3 refills | Status: DC

## 2017-11-20 NOTE — Discharge Summary (Signed)
Discharge Summary    Patient ID: Christopher MiuRobert [name],  MRN: [id number], DOB/AGE: 51/03/1966 51 y.o.  Admit date: 11/17/2017 Discharge date: 11/20/2017  Primary Care Provider: Patient, No Pcp Per Primary Cardiologist: Dr Antoine PocheHochrein (new)  Discharge Diagnoses    Principal Problem:   NSTEMI (non-ST elevated myocardial infarction) [facility](HCC) Active Problems:   Cocaine abuse (HCC)   Dyslipidemia   Smoker   Allergies No Known Allergies  Diagnostic Studies/Procedures    Echo 11/19/17 _____________   History of Present Illness     51 y/o male with a history of remote PCI, admitted with chest pain and positive Troponin.  Hospital Course      Christopher [name] is a 51 y/o Caucasian male with a history of remote [place] in [place] when he was in his 3420's. He is a current smoker and on no medications with no PCP. He presented to the ED 11/19/17 with SSCP. He admitted to cocaine use. His EKG had no acute changes. His initial Troponin was 0.07 which was his peak. He was admitted placed on ASA, Heparin, NTG, statin, and Coreg. His Troponin trended down and when seen on rounds 11/20/17 he is pain free. Dr Antoine PocheHochrein feels he can discharged with plans for an OP POET. We stopped his beta blocker at discharge secondary to history of cocaine use. His Heparin and O2 will be stopped and he'll go home later today if he is pain free.  _____________  Discharge Vitals Blood pressure 124/75, pulse 61, temperature 98.1 F (36.7 C), temperature source Oral, resp. rate 19, height 5\' 9"  (1.753 m), weight 239 lb 14.4 oz (108.8 kg), SpO2 97 %.  Filed Weights   11/17/17 2100 11/18/17 0209 11/20/17 0458  Weight: 240 lb (108.9 kg) 231 lb 4.8 oz (104.9 kg) 239 lb 14.4 oz (108.8 kg)    Labs & Radiologic Studies    CBC Recent Labs    11/17/17 2110  11/19/17 0204 11/20/17 0425  WBC  --    < > 6.4 7.1  NEUTROABS 7.4  --   --   --   HGB  --    < > 14.3 14.8  HCT  --    < > 43.5 44.2  MCV  --    < > 93.1 92.9  PLT  --    < >  199 189   < > = values in this interval not displayed.   Basic Metabolic Panel Recent Labs    84/69/6212/21/18 2109 11/18/17 0219  NA 142 140  K 3.6 3.5  CL 108 106  CO2 22 24  GLUCOSE 104* 103*  BUN 15 14  CREATININE 0.99 1.11  CALCIUM 9.4 9.0   Liver Function Tests Recent Labs    11/17/17 2110  AST 31  ALT 35  ALKPHOS 70  BILITOT 1.0  PROT 7.2  ALBUMIN 4.4   No results for input(s): LIPASE, AMYLASE in the last 72 hours. Cardiac Enzymes Recent Labs    11/17/17 2109 11/18/17 0001 11/18/17 0219  TROPONINI 0.07* 0.06* 0.04*   BNP Invalid input(s): POCBNP D-Dimer No results for input(s): DDIMER in the last 72 hours. Hemoglobin A1C Recent Labs    11/18/17 0219  HGBA1C 5.4   Fasting Lipid Panel Recent Labs    11/18/17 0219  CHOL 194  HDL 36*  LDLCALC 146*  TRIG 61  CHOLHDL 5.4   Thyroid Function Tests No results for input(s): TSH, T4TOTAL, T3FREE, THYROIDAB in the last 72 hours.  Invalid input(s): FREET3 _____________  Dg Chest 2 View  Result Date: 11/17/2017 CLINICAL DATA:  Generalized chest pain with nausea EXAM: CHEST  2 VIEW COMPARISON:  None. FINDINGS: Minimal atelectasis at the left base. No acute consolidation or effusion. Normal cardiomediastinal silhouette. No pneumothorax. IMPRESSION: No active cardiopulmonary disease. Minimal atelectasis at the left lung base. Electronically Signed   By: Jasmine PangKim  Fujinaga M.D.   On: 11/17/2017 21:42   Disposition   Pt is being discharged home today in good condition.  Follow-up Plans & Appointments     Discharge Instructions    Amb Referral to Cardiac Rehabilitation   Complete by:  As directed    Diagnosis:  NSTEMI      Discharge Medications   Allergies as of 11/20/2017   No Known Allergies     Medication List    TAKE these medications   acetaminophen 325 MG tablet Commonly known as:  TYLENOL Take 2 tablets (650 mg total) by mouth every 4 (four) hours as needed for headache or mild pain.     aspirin 81 MG EC tablet Take 1 tablet (81 mg total) by mouth daily. Start taking on:  11/21/2017   atorvastatin 80 MG tablet Commonly known as:  LIPITOR Take 1 tablet (80 mg total) by mouth daily at 6 PM.   nitroGLYCERIN 0.4 MG SL tablet Commonly known as:  NITROSTAT Place 1 tablet (0.4 mg total) under the tongue every 5 (five) minutes x 3 doses as needed for chest pain.        Aspirin prescribed at discharge?  Yes High Intensity Statin Prescribed? (Lipitor 40-80mg  or Crestor 20-40mg ): Yes Beta Blocker Prescribed? No: cocaine use For EF <40%, was ACEI/ARB Prescribed? No: NA ADP Receptor Inhibitor Prescribed? (i.e. Plavix etc.-Includes Medically Managed Patients): No: NA For EF <40%, Aldosterone Inhibitor Prescribed? No: NA Was EF assessed during THIS hospitalization? Yes Was Cardiac Rehab II ordered? (Included Medically managed Patients): Yes   Outstanding Labs/Studies     Duration of Discharge Encounter   Greater than 30 minutes including physician time.  Jolene ProvostSigned, Esaias Cleavenger PA 11/20/2017, 10:29 AM

## 2017-11-20 NOTE — Progress Notes (Signed)
Progress Note  Patient Name: Christopher MiuRobert [name] Date of Encounter: 11/20/2017  Primary Cardiologist:   New (Dr. Ladona Ridgelaylor)  Subjective   No further chest pain.    Inpatient Medications    Scheduled Meds: . aspirin EC  81 mg Oral Daily  . atorvastatin  80 mg Oral q1800   Continuous Infusions: . heparin 1,800 Units/hr (11/20/17 16100608)  . nitroGLYCERIN Stopped (11/18/17 0217)   PRN Meds: acetaminophen, nitroGLYCERIN, ondansetron (ZOFRAN) IV, pneumococcal 23 valent vaccine   Vital Signs    Vitals:   11/19/17 1955 11/20/17 0027 11/20/17 0458 11/20/17 0830  BP: 119/70 110/67 128/77 124/75  Pulse: (!) 57 64 60 61  Resp: 20 16 18 19   Temp: 97.7 F (36.5 C) 97.7 F (36.5 C) 98.1 F (36.7 C) 98.1 F (36.7 C)  TempSrc: Oral Oral Oral Oral  SpO2: 97% 98% 96% 97%  Weight:   239 lb 14.4 oz (108.8 kg)   Height:   5\' 9"  (1.753 m)     Intake/Output Summary (Last 24 hours) at 11/20/2017 1005 Last data filed at 11/20/2017 0500 Gross per 24 hour  Intake 1144 ml  Output 400 ml  Net 744 ml   Filed Weights   11/17/17 2100 11/18/17 0209 11/20/17 0458  Weight: 240 lb (108.9 kg) 231 lb 4.8 oz (104.9 kg) 239 lb 14.4 oz (108.8 kg)    Telemetry    NSR - Personally Reviewed  ECG    NA - Personally Reviewed  Physical Exam   GEN: No acute distress.   Neck: No  JVD Cardiac: RRR, no murmurs, rubs, or gallops.  Respiratory: Clear  to auscultation bilaterally. GI: Soft, nontender, non-distended  MS: No  edema; No deformity. Neuro:  Nonfocal  Psych: Normal affect   Labs    Chemistry Recent Labs  Lab 11/17/17 2109 11/17/17 2110 11/18/17 0219  NA 142  --  140  K 3.6  --  3.5  CL 108  --  106  CO2 22  --  24  GLUCOSE 104*  --  103*  BUN 15  --  14  CREATININE 0.99  --  1.11  CALCIUM 9.4  --  9.0  PROT  --  7.2  --   ALBUMIN  --  4.4  --   AST  --  31  --   ALT  --  35  --   ALKPHOS  --  70  --   BILITOT  --  1.0  --   GFRNONAA >60  --  >60  GFRAA >60  --  >60    ANIONGAP 12  --  10     Hematology Recent Labs  Lab 11/18/17 0219 11/19/17 0204 11/20/17 0425  WBC 8.2 6.4 7.1  RBC 5.03 4.67 4.76  HGB 15.7 14.3 14.8  HCT 46.0 43.5 44.2  MCV 91.5 93.1 92.9  MCH 31.2 30.6 31.1  MCHC 34.1 32.9 33.5  RDW 13.8 14.0 13.6  PLT 245 199 189    Cardiac Enzymes Recent Labs  Lab 11/17/17 2109 11/18/17 0001 11/18/17 0219  TROPONINI 0.07* 0.06* 0.04*   No results for input(s): TROPIPOC in the last 168 hours.   BNP Recent Labs  Lab 11/18/17 0219  BNP 34.4     DDimer No results for input(s): DDIMER in the last 168 hours.   Radiology    No results found.  Cardiac Studies   Echo  11/18/17  Study Conclusions  - Left ventricle: The cavity size was normal. Wall thickness  was   normal. Systolic function was normal. The estimated ejection   fraction was in the range of 50% to 55%. Wall motion was normal;   there were no regional wall motion abnormalities. Doppler   parameters are consistent with abnormal left ventricular   relaxation (grade 1 diastolic dysfunction).  Impressions:  - Normal LV systolic function; probable mild diastolic dysfunction.   Patient Profile     51 y.o. male history of CAD/stent (not at this facility and no records that I could find) admitted with chest pain.  Has positive screen for cocaine.    Assessment & Plan    CHEST PAIN:  Troponin elevated but flat trend. Echo EF NL. Given history of CAD and borderline troponin will plan out patient POET (Plain Old Exercise Treadmill).  Of note we talked about the need to stop cocaine and tobacco.    For questions or updates, please contact [facility] Please consult [email or web address] for contact info under Cardiology/STEMI.   Signed, Rollene RotundaJames Nasser Ku, MD  11/20/2017, 10:05 AM

## 2017-11-20 NOTE — Progress Notes (Signed)
ANTICOAGULATION CONSULT NOTE Pharmacy Consult for Heparin Indication: chest pain/ACS  No Known Allergies  Patient Measurements: Height: 5\' 9"  (175.3 cm) Weight: 231 lb 4.8 oz (104.9 kg) IBW/kg (Calculated) : 70.7  Vital Signs: Temp: 98.1 F (36.7 C) (12/24 0458) Temp Source: Oral (12/24 0458) BP: 128/77 (12/24 0458) Pulse Rate: 60 (12/24 0458)  Labs: Recent Labs    11/17/17 2109 11/18/17 0001 11/18/17 0219  11/18/17 2251 11/19/17 0204 11/20/17 0425  HGB 16.3  --  15.7  --   --  14.3 14.8  HCT 48.0  --  46.0  --   --  43.5 44.2  PLT 242  --  245  --   --  199 189  HEPARINUNFRC  --   --   --    < > 0.41 0.33 0.22*  CREATININE 0.99  --  1.11  --   --   --   --   TROPONINI 0.07* 0.06* 0.04*  --   --   --   --    < > = values in this interval not displayed.    Estimated Creatinine Clearance: 94 mL/min (by C-G formula based on SCr of 1.11 mg/dL).  Assessment: 51 y.o. male with chest pain for heparin  Goal of Therapy:  Heparin level 0.3-0.7 units/ml Monitor platelets by anticoagulation protocol: Yes   Plan:  Increase Heparin 1800 units/hr  Geannie RisenGreg Yolando Gillum, PharmD, BCPS

## 2017-11-20 NOTE — Discharge Instructions (Signed)
Acute Coronary Syndrome Acute coronary syndrome (ACS) is a serious problem in which there is suddenly not enough blood and oxygen reaching the heart. ACS can result in chest pain or a heart attack. What are the causes? This condition may be caused by:  A buildup of fat and cholesterol inside of the arteries (atherosclerosis). This is the most common cause. The buildup (plaque) can cause the blood vessels in your heart (coronary arteries) to become narrow or blocked. Plaque can also break off to form a clot.  A coronary spasm.  A tearing of the coronary artery (spontaneous coronary artery dissection).  Low blood pressure (hypotension).  An abnormal heart beat (arrhythmia).  Using cocaine or methamphetamine.  What increases the risk? The following factors may make you more likely to develop this condition:  Age.  History of chest pain, heart attack, or stroke.  Being male.  Family history of chest pain, heart disease, or stroke.  Smoking.  Inactivity.  Being overweight.  High cholesterol.  High blood pressure (hypertension).  Diabetes.  Excessive alcohol use.  What are the signs or symptoms? Common symptoms of this condition include:  Chest pain. The pain may last long, or may stop and come back (recur). It may feel like: ? Crushing or squeezing. ? Tightness, pressure, fullness, or heaviness.  Arm, neck, jaw, or back pain.  Heartburn or indigestion.  Shortness of breath.  Nausea.  Sudden cold sweats.  Lightheadedness.  Dizziness.  Tiredness (fatigue).  Sometimes there are no symptoms. How is this diagnosed? This condition may be diagnosed through:  An electrocardiogram (ECG). This test records the impulses of the heart.  Blood tests.  A CT scan of the chest.  A coronary angiogram. This procedure checks for a blockage in the coronary arteries.  How is this treated? Treatment for this condition may include:  Oxygen.  Medicines, such  as: ? Antiplatelet medicines and blood-thinning medicines, such as aspirin. These help prevent blood clots. ? Fibrinolytic therapy. This breaks apart a blood clot. ? Blood pressure medicines. ? Nitroglycerin. ? Pain medicine. ? Cholesterol medicine.  A procedure called coronary angioplasty and stenting. This is done to widen a narrowed artery and keep it open.  Coronary artery bypass surgery. This allows blood to pass the blockage to reach your heart.  Cardiac rehabilitation. This is a program that helps improve your health and well-being. It includes exercise training, education, and counseling to help you recover.  Follow these instructions at home: Eating and drinking  Follow a heart-healthy, low-salt (sodium) diet.  Use healthy cooking methods such as roasting, grilling, broiling, baking, poaching, steaming, or stir-frying.  Talk to a dietitian to learn about healthy cooking methods and how to eat less sodium. Medicines  Take over-the-counter and prescription medicines only as told by your health care provider.  Do not take these medicines unless your health care provider approves: ? Nonsteroidal anti-inflammatory drugs (NSAIDs), such as ibuprofen, naproxen, or celecoxib. ? Vitamin supplements that contain vitamin A or vitamin E. ? Hormone replacement therapy that contains estrogen. Activity  Join a cardiac rehabilitation program.  Ask your health care provider: ? What activities and exercises are safe for you. ? If you should follow specific instructions about lifting, driving, or climbing stairs.  If you are taking aspirin and another blood thinning medicine, avoid activities that are likely to result in an injury. The medicines can increase your risk of bleeding. Lifestyle  Do not use any products that contain nicotine or tobacco, such as  cigarettes and e-cigarettes. If you need help quitting, ask your health care provider.  If you drink alcohol and your health care  provider says it is okay to drink, limit your alcohol intake to no more than 1 drink per day. One drink equals 12 oz of beer, 5 oz of wine, or 1 oz of hard liquor.  Maintain a healthy weight. If you need to lose weight, do it in a way that has been approved by your health care provider. General instructions  Tell all your health care providers about your heart condition, including your dentist. Some medicines can increase your risk of arrhythmia.  Manage other health conditions, such as hypertension and diabetes. These conditions affect your heart.  Learn ways to manage stress.  Get screened for depression, and seek treatment if needed.  Monitor your blood pressure if told by your health care provider.  Keep your vaccinations up to date. Get the annual influenza vaccine.  Keep all follow-up visits as told by your health care provider. This is important. Contact a health care provider if:  You feel overwhelmed or sad.  You have trouble with your daily activities. Get help right away if:  You have pain in your chest, neck, arm, jaw, stomach, or back that recurs, and: ? Lasts more than a few minutes. ? Is not relieved by taking the medicineyour health care provider prescribed.  You have unexplained: ? Heavy sweating. ? Heartburn or indigestion. ? Shortness of breath. ? Difficulty breathing. ? Nausea or vomiting. ? Fatigue. ? Nervousness or anxiety. ? Weakness. ? Diarrhea. ? Dark stools or blood in the stool.  You have sudden lightheadedness or dizziness.  Your blood pressure is higher than 180/120  You faint.  You feel like hurting yourself or think about taking your own life. These symptoms may represent a serious problem that is an emergency. Do not wait to see if the symptoms will go away. Get medical help right away. Call your local emergency services (911 in the [place] Do not drive yourself to the clinic or hospital. Summary  Acute coronary syndrome (ACS) is a  when there is not enough blood and oxygen being supplied to the heart. ACS can result in chest pain or a heart attack.  Acute coronary syndrome is a medical emergency. If you have any symptoms of this condition, get help right away.  Treatment includes oxygen, medicines, and procedures to open the blocked arteries and restore blood flow. This information is not intended to replace advice given to you by your health care provider. Make sure you discuss any questions you have with your health care provider. Document Released: 11/14/2005 Document Revised: 12/16/2016 Document Reviewed: 12/16/2016 Elsevier Interactive Patient Education  2018 [employer]. Stimulant Use Disorder-Cocaine Cocaine belongs to a group of powerful drugs known as stimulants. Common street names for cocaine include coke, crack, blow, snow, C, powder, and nose candy. Cocaine has some medical uses, but it is often misused because of the effects that it produces. These effects include:  A feeling of extreme pleasure (euphoria).  Alertness.  A high energy level.  Stimulant use disorder is when your stimulant use disrupts your daily life. It may disrupt your relationships and how you do your job. Stimulant use disorder can be dangerous. Cocaine increases your blood pressure and heart rate. Using it can lead to a heart attack or stroke. Cocaine can also make your heart rate irregular and cause seizures. These problems can lead to death. What are the causes?  This condition is caused by misusing cocaine. Many people start using cocaine because it makes them feel good. Over time, they get addicted to it. When they try to stop using it, they feel sick. What increases the risk? This condition is more likely to develop in:  People who misuse other drugs.  People with a family history of misusing drugs.  What are the signs or symptoms? Symptoms of this condition include:  Using greater amounts of cocaine than you want to, or  using cocaine for longer than you want to.  Trying several times to use less cocaine or to control your cocaine use.  Craving cocaine.  Spending a lot of time getting cocaine, using it, or recovering from its effects.  Having problems at work, at school, at home, or with relationships because of cocaine use.  Giving up or cutting down on important life activities because of cocaine use.  Using cocaine when it is dangerous, such as when driving a car.  Continuing to use cocaine even though it is causing or has led to a physical problem, such as: ? Malnutrition. ? Nosebleeds. ? Chest pain. ? High blood pressure. ? A hole between the part of your nose that separates your nostrils (perforated nasal septum). ? Lung and kidney damage.  Continuing to use cocaine even though it is causing a mental problem, such as: ? Schizophrenia-like symptoms. ? Depression. ? Bipolar mood swings. ? Anxiety. ? Sleep problems.  Needing more and more cocaine to get the same effect that you want (building up a tolerance).  Having symptoms of withdrawal when you stop using cocaine. Symptoms of withdrawal include: ? Depression. ? Irritability. ? Low energy. ? Restlessness. ? Bad dreams. ? Too little or too much sleep. ? Increased appetite.  How is this diagnosed? This condition is diagnosed with an assessment. During the assessment, your health care provider will ask about your cocaine use and about how it affects your life. Your health care provider may also:  Perform a physical exam or do lab tests to see if you have physical problems resulting from cocaine use.  Screen for drug use.  Refer you to a mental health professional for evaluation.  How is this treated? Treatment for this condition is usually provided by mental health professionals with training in substance use disorders. Treatment may involve:  Counseling. This treatment is also called talk therapy. It is provided by substance  use treatment counselors. A counselor can address the reasons you use cocaine and suggest ways to keep you from using it again. The goals of talk therapy are to: ? Find healthy activities to replace using cocaine. ? Identify and avoid what triggers your cocaine use. ? Help you learn how to handle cravings.  Support groups. Support groups are run by people who have quit using stimulants. They provide emotional support, advice, and guidance.  Medicines.  Follow these instructions at home:  Take over-the-counter and prescription medicines only as told by your health care provider.  Check with your health care provider before starting any new medicines.  Do not use any products that contain nicotine or tobacco, such as cigarettes and e-cigarettes. If you need help quitting, ask your health care provider.  Keep all follow-up visits as told by your health care provider. This is important. Where to find more information:  [employer] on Drug Abuse: http://www.price-smith.com/www.drugabuse.gov  Substance Abuse and Mental Health Services Administration: [employer].com.ptwww.samhsa.gov Contact a health care provider if:  You are not able to take  your medicines as told.  You use cocaine again.  Your symptoms get worse. Get help right away if:  You have serious thoughts about hurting yourself or others.  You have a seizure.  You have chest pain.  You have sudden weakness.  You lose some of your vision.  You lose some of your speech. If you ever feel like you may hurt yourself or others, or have thoughts about taking your own life, get help right away. You can go to your nearest emergency department or call:  Your local emergency services (911 in the [place]  A suicide crisis helpline, such as the National Suicide Prevention Lifeline at 442-111-42191-(272)638-0869. This is open 24 hours a day.  This information is not intended to replace advice given to you by your health care provider. Make sure you discuss any questions you have  with your health care provider. Document Released: 11/11/2000 Document Revised: 08/26/2016 Document Reviewed: 08/26/2016 Elsevier Interactive Patient Education  [employer]2018 Elsevier Inc.

## 2017-11-23 ENCOUNTER — Encounter: Payer: Self-pay | Admitting: Physician Assistant

## 2017-11-23 ENCOUNTER — Telehealth: Payer: Self-pay | Admitting: Physician Assistant

## 2017-11-23 ENCOUNTER — Encounter (HOSPITAL_COMMUNITY): Payer: Self-pay

## 2017-11-23 NOTE — Telephone Encounter (Signed)
The ETT (GXT) was ordered on 12/24 at 10:18 am. It is not clear as to the delay in scheduling but [facility] requesting [facility] to be scheduled ASAP.

## 2017-11-23 NOTE — Telephone Encounter (Signed)
Received call from nurse on 4E that patient returned to site of recent admission requesting work note from recent hospitalization. DC 12/24 with CP/mildly elevated troponin. It was recommended that he have OP ETT and f/u. The f/u appears to have been scheduled but the treadmill test has not. Patient was under the impression stress test was on the 18th (the day of his f/u appt). He has become upset with nurse citing that if he has to wait until the 18th to be cleared to return to work that he will lose his house. D/w Dr. Antoine PocheHochrein who evaluated pt in hospital. Given atypical nature of sx, patient is OK to return to work at [employer]. Letter tubed up to 4e. Will route message to triage to help find out why stress test has not been scheduled yet, and to help coordinate this.   Katryna Tschirhart PA-C

## 2017-11-30 ENCOUNTER — Ambulatory Visit (INDEPENDENT_AMBULATORY_CARE_PROVIDER_SITE_OTHER): Payer: Self-pay

## 2017-11-30 DIAGNOSIS — I214 Non-ST elevation (NSTEMI) myocardial infarction: Secondary | ICD-10-CM

## 2017-11-30 LAB — EXERCISE TOLERANCE TEST
Estimated workload: 9.3 METS
Exercise duration (min): 7 min
Exercise duration (sec): 30 s
MPHR: 169 {beats}/min
Peak HR: 153 {beats}/min
Percent HR: 90 %
RPE: 17
Rest HR: 76 {beats}/min

## 2017-12-15 ENCOUNTER — Ambulatory Visit (INDEPENDENT_AMBULATORY_CARE_PROVIDER_SITE_OTHER): Payer: Self-pay | Admitting: Cardiology

## 2017-12-15 ENCOUNTER — Encounter: Payer: Self-pay | Admitting: Cardiology

## 2017-12-15 VITALS — BP 140/84 | HR 63 | Ht 69.0 in | Wt 246.0 lb

## 2017-12-15 DIAGNOSIS — I214 Non-ST elevation (NSTEMI) myocardial infarction: Secondary | ICD-10-CM

## 2017-12-15 DIAGNOSIS — F1410 Cocaine abuse, uncomplicated: Secondary | ICD-10-CM

## 2017-12-15 DIAGNOSIS — E785 Hyperlipidemia, unspecified: Secondary | ICD-10-CM

## 2017-12-15 DIAGNOSIS — F17200 Nicotine dependence, unspecified, uncomplicated: Secondary | ICD-10-CM

## 2017-12-15 MED ORDER — ROSUVASTATIN CALCIUM 10 MG PO TABS: 10.0000 mg | ORAL_TABLET | Freq: Every day | ORAL | 3 refills | Status: DC

## 2017-12-15 NOTE — Progress Notes (Addendum)
12/15/2017 Christopher [name]   [birth year]  [id number]  Primary Physician Patient, No Pcp Per Primary Cardiologist: Dr Antoine Poche  HPI:  52 y/o married Caucasian male with a history of remote PCI in [place] when he was in his 2's. 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 He is a current smoker and was on no medications and had  no PCP. He presented to the [facility] ED 11/19/17 with SSCP. He admitted to cocaine use. His EKG had no acute changes. His initial Troponin was 0.07 which was his peak. He was admitted placed on ASA, Heparin, NTG, statin, and Coreg. His Troponin trended down and when seen on rounds 11/20/17 and he was discharged. Echo showed normal LVF-no WMA. A POET as an OP 11/30/17 was low risk. He is seen in the office today in follow up. He denies chest pain. He continues to smoke. He says the Lipitor caused some vague discomfort and a rash on his arms and he stopped it.    Current Outpatient Medications  Medication Sig Dispense Refill  . acetaminophen (TYLENOL) 325 MG tablet Take 2 tablets (650 mg total) by mouth every 4 (four) hours as needed for headache or mild pain.    Marland Kitchen aspirin EC 81 MG EC tablet Take 1 tablet (81 mg total) by mouth daily.    . nitroGLYCERIN (NITROSTAT) 0.4 MG SL tablet Place 1 tablet (0.4 mg total) under the tongue every 5 (five) minutes x 3 doses as needed for chest pain. 25 tablet 2  . rosuvastatin (CRESTOR) 10 MG tablet Take 1 tablet (10 mg total) by mouth daily. 90 tablet 3   No current facility-administered medications for this visit.     Allergies  Allergen Reactions  . Lipitor [Atorvastatin Calcium] Rash    Pt says he had a rash on his arms with Lipitor and stopped it    Past Medical History:  Diagnosis Date  . CAD (coronary artery disease)    remote PCI  . Cocaine abuse (HCC)   . Dyslipidemia   . NSTEMI (non-ST elevated myocardial infarction) (HCC) 11/19/2017   in setting of cocaine use  . Smoker     Social History   Socioeconomic History   . Marital status: Married    Spouse name: Not on file  . Number of children: Not on file  . Years of education: Not on file  . Highest education level: Not on file  Social Needs  . Financial resource strain: Not on file  . Food insecurity - worry: Not on file  . Food insecurity - inability: Not on file  . Transportation needs - medical: Not on file  . Transportation needs - non-medical: Not on file  Occupational History  . Not on file  Tobacco Use  . Smoking status: Current Every Day Smoker    Packs/day: 1.00    Types: Cigarettes  . Smokeless tobacco: Never Used  Substance and Sexual Activity  . Alcohol use: No    Frequency: Never  . Drug use: Yes    Frequency: 1.0 times per week    Types: Cocaine    Comment: "first time used again in years" per pt  . Sexual activity: Not on file  Other Topics Concern  . Not on file  Social History Narrative  . Not on file     Family History  Problem Relation Age of Onset  . Coronary artery disease Father 47       CABG     Review  of Systems: General: negative for chills, fever, night sweats or weight changes.  Cardiovascular: negative for chest pain, dyspnea on exertion, edema, orthopnea, palpitations, paroxysmal nocturnal dyspnea or shortness of breath Dermatological: negative for rash Respiratory: negative for cough or wheezing Urologic: negative for hematuria Abdominal: negative for nausea, vomiting, diarrhea, bright red blood per rectum, melena, or hematemesis Neurologic: negative for visual changes, syncope, or dizziness All other systems reviewed and are otherwise negative except as noted above.    Blood pressure 140/84, pulse 63, height 5\' 9"  (1.753 m), weight 246 lb (111.6 kg).  General appearance: alert, cooperative, no distress and mildly obese Neck: no carotid bruit and no JVD Lungs: clear to auscultation bilaterally Heart: regular rate and rhythm Extremities: extremities normal, atraumatic, no cyanosis or  edema Skin: Skin color, texture, turgor normal. No rashes or lesions or multiple tattoos Neurologic: Grossly normal  EKG NSR  ASSESSMENT AND PLAN:   NSTEMI (non-ST elevated myocardial infarction) (HCC) Seen today post hospital  Dyslipidemia LDL 147. Pt thinks he had a rash from Lipitor and stopped it.   Smoker Pt continues to smoke. Discussed cessation strategies with the pt and his wife.   Cocaine abuse (HCC) None since his hospitalization   PLAN  I suggested we try Crestor 10 mg. I'll see him back in 3 months. We discussed smoking cessation and lifestyle changes.   Corine ShelterLuke Navarro Nine PA-C 12/15/2017 11:28 AM

## 2017-12-15 NOTE — Assessment & Plan Note (Signed)
None since his hospitalization

## 2017-12-15 NOTE — Assessment & Plan Note (Signed)
Seen today post hospital  

## 2017-12-15 NOTE — Patient Instructions (Signed)
Medication Instructions:   Your physician has recommended you make the following change in your medication:   Stop atorvastatin (lipitor).  Start rosuvastatin (crestor) 10 mg by mouth at bedtime.  Continue all other medications the same.  Labwork:  NONE  Testing/Procedures:  NONE  Follow-Up:  Your physician recommends that you schedule a follow-up appointment in: 3 months with [facility] International[facility] Kilroy.  Any Other Special Instructions Will Be Listed Below (If Applicable).  If you need a refill on your cardiac medications before your next appointment, please call your pharmacy.

## 2017-12-15 NOTE — Assessment & Plan Note (Signed)
LDL 147. Pt thinks he had a rash from Lipitor and stopped it.

## 2017-12-15 NOTE — Assessment & Plan Note (Signed)
Pt continues to smoke. Discussed cessation strategies with the pt and his wife.

## 2018-03-16 ENCOUNTER — Ambulatory Visit: Payer: Self-pay | Admitting: Cardiology

## 2018-03-16 DIAGNOSIS — R0989 Other specified symptoms and signs involving the circulatory and respiratory systems: Secondary | ICD-10-CM

## 2018-03-19 ENCOUNTER — Ambulatory Visit (INDEPENDENT_AMBULATORY_CARE_PROVIDER_SITE_OTHER): Payer: Worker's Compensation

## 2018-03-19 ENCOUNTER — Other Ambulatory Visit: Payer: Self-pay

## 2018-03-19 ENCOUNTER — Ambulatory Visit (INDEPENDENT_AMBULATORY_CARE_PROVIDER_SITE_OTHER): Payer: Worker's Compensation | Admitting: Family Medicine

## 2018-03-19 ENCOUNTER — Encounter: Payer: Self-pay | Admitting: Family Medicine

## 2018-03-19 VITALS — BP 138/82 | HR 80 | Temp 98.0°F | Resp 16 | Wt 237.0 lb

## 2018-03-19 DIAGNOSIS — S139XXA Sprain of joints and ligaments of unspecified parts of neck, initial encounter: Secondary | ICD-10-CM

## 2018-03-19 DIAGNOSIS — S43492A Other sprain of left shoulder joint, initial encounter: Secondary | ICD-10-CM

## 2018-03-19 DIAGNOSIS — Y990 Civilian activity done for income or pay: Secondary | ICD-10-CM

## 2018-03-19 DIAGNOSIS — R202 Paresthesia of skin: Secondary | ICD-10-CM

## 2018-03-19 DIAGNOSIS — M25512 Pain in left shoulder: Secondary | ICD-10-CM

## 2018-03-19 MED ORDER — PREDNISONE 20 MG PO TABS: 20.0000 mg | ORAL_TABLET | Freq: Two times a day (BID) | ORAL | 0 refills | Status: DC

## 2018-03-19 MED ORDER — METHOCARBAMOL 500 MG PO TABS: 500.0000 mg | ORAL_TABLET | Freq: Four times a day (QID) | ORAL | 0 refills | Status: DC | PRN

## 2018-03-19 NOTE — Patient Instructions (Addendum)
IF you received an x-ray today, you will receive an invoice from [facility]. Please contact [facility] at [phone] with questions or concerns regarding your invoice.   IF you received labwork today, you will receive an invoice from [facility]. Please contact [facility] at [phone] with questions or concerns regarding your invoice.   Our billing staff will not be able to assist you with questions regarding bills from these companies.  You will be contacted with the lab results as soon as they are available. The fastest way to get your results is to activate your My Chart account. Instructions are located on the last page of this paperwork. If you have not heard from us regarding the results in 2 weeks, please contact this office.      Shoulder Sprain A shoulder sprain is a partial or complete tear in one of the tough, fiber-like tissues (ligaments) in the shoulder. The ligaments in the shoulder help to hold the shoulder in place. What are the causes? This condition may be caused by:  A fall.  A hit to the shoulder.  A twist of the arm.  What increases the risk? This condition is more likely to develop in:  People who play sports.  People who have problems with balance or coordination.  What are the signs or symptoms? Symptoms of this condition include:  Pain when moving the shoulder.  Limited ability to move the shoulder.  Swelling and tenderness on top of the shoulder.  Warmth in the shoulder.  A change in the shape of the shoulder.  Redness or bruising on the shoulder.  How is this diagnosed? This condition is diagnosed with a physical exam. During the exam, you may be asked to do simple exercises with your shoulder. You may also have imaging tests, such as X-rays, MRI, or a CT scan. These tests can show how severe the sprain is. How is this treated? This condition may be treated with:  Rest.  Pain medicine.  Ice.  A sling or  brace. This is used to keep the arm still while the shoulder is healing.  Physical therapy or rehabilitation exercises. These help to improve the range of motion and strength of the shoulder.  Surgery (rare). Surgery may be needed if the sprain caused a joint to become unstable. Surgery may also be needed to reduce pain.  Some people may develop ongoing shoulder pain or lose some range of motion in the shoulder. However, most people do not develop long-term problems. Follow these instructions at home:  Rest.  Ask your health care provider when it is safe for you to drive if you have a sling or brace on your shoulder.  Take over-the-counter and prescription medicines only as told by your health care provider.  If directed, apply ice to the area: ? Put ice in a plastic bag. ? Place a towel between your skin and the bag. ? Leave the ice on for 20 minutes, 2-3 times per day.  If you were given a shoulder sling or brace: ? Wear it as told. ? Remove it to shower or bathe. ? Move your arm only as much as told by your health care provider, but keep your hand moving to prevent swelling.  If you were shown how to do any exercises, do them as told by your health care provider.  Keep all follow-up visits as told by your health care provider. This is important. Contact a health care provider if:  Your pain  worse.  Your pain is not relieved with medicines.  You have increased redness or swelling. Get help right away if:  You have a fever.  You cannot move your arm or shoulder.  You develop severe numbness or tingling in your arm, hand, or fingers.  Your arm, hand, or fingers turn blue, white, or gray and feel cold. This information is not intended to replace advice given to you by your health care provider. Make sure you discuss any questions you have with your health care provider. Document Released: 04/02/2009 Document Revised: 07/10/2016 Document Reviewed: 03/09/2015 Elsevier  Interactive Patient Education  2018 Elsevier Inc.  

## 2018-03-19 NOTE — Progress Notes (Signed)
Subjective:    Patient ID: Christopher [name], male    DOB: 12/01/1965, 52 y.o.   MRN: [id number]  03/19/2018  Shoulder Pain (pt states while at work today he feels like he may have pulled a muscle in his left shoulder, pt stares his arm is numb with tingling down to his finger tips )    HPI This 52 y.o. male presents for evaluation work related of shoulder pain LEFT.   Today, when under a machine, got down on side. Felt a pull or pop; everything went numb and tingling; now having horrible headache. If moves fingers, feels in elbow.  If tsops, shooting pain in head.    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  [employer] working on it; had t Chief Operating Officeroremove lent.  When went to climb down; usually steps down and lean sidewas. Went to lay on back and hit L shoulder first; felt pull and pop; fingers went completely numb to fingers.  Still numb in all five fingers LEFT hand.  Lateral shoulder and into forearm.Throbbing at deltoid; and shoot down to fingers.   Review of Systems  Constitutional: Negative for chills, diaphoresis, fatigue and fever.  Musculoskeletal: Positive for arthralgias, myalgias, neck pain and neck stiffness. Negative for back pain, gait problem and joint swelling.  Skin: Negative for color change, pallor, rash and wound.  Neurological: Positive for weakness, numbness and headaches. Negative for dizziness, tremors, seizures, syncope, facial asymmetry, speech difficulty and light-headedness.       Objective:    BP 138/82   Pulse 80   Temp 98 F (36.7 C) (Oral)   Resp 16   Wt 237 lb (107.5 kg)   SpO2 95%   BMI 35.00 kg/m  Physical Exam  Constitutional: He is oriented to person, place, and time. He appears well-developed and well-nourished. No distress.  HENT:  Head: Normocephalic and atraumatic.  Eyes: Pupils are equal, round, and reactive to light. Conjunctivae and EOM are normal.  Neck: Normal range of motion. Neck supple. Carotid bruit is not  present. No thyromegaly present.  Cardiovascular: Normal rate, regular rhythm, normal heart sounds and intact distal pulses. Exam reveals no gallop and no friction rub.  No murmur heard. Pulmonary/Chest: Effort normal and breath sounds normal. He has no wheezes. He has no rales.  Musculoskeletal:       Left shoulder: He exhibits decreased range of motion, tenderness, pain, spasm and decreased strength. He exhibits no bony tenderness, no swelling, no effusion, no crepitus, no deformity, no laceration and normal pulse.       Left elbow: He exhibits normal range of motion, no swelling, no effusion, no deformity and no laceration. No tenderness found. No radial head, no medial epicondyle, no lateral epicondyle and no olecranon process tenderness noted.       Left wrist: Normal. He exhibits normal range of motion, no tenderness, no bony tenderness, no swelling, no effusion, no crepitus, no deformity and no laceration.       Cervical back: He exhibits decreased range of motion, tenderness, pain and spasm. He exhibits no bony tenderness, no swelling, no edema, no deformity, no laceration and normal pulse.       Left forearm: Normal. He exhibits no tenderness, no bony tenderness, no swelling, no edema, no deformity and no laceration.       Left hand: Normal. He exhibits normal range of motion, no tenderness, normal capillary refill and no deformity. Normal sensation noted. Normal  strength noted.  Lymphadenopathy:    He has no cervical adenopathy.  Neurological: He is alert and oriented to person, place, and time. He displays normal reflexes. No cranial nerve deficit or sensory deficit. He exhibits normal muscle tone. Coordination normal.  Skin: Skin is warm and dry. No rash noted. He is not diaphoretic.  Psychiatric: He has a normal mood and affect. His behavior is normal.  Nursing note and vitals reviewed.  No results found.        Assessment & Plan:   1. Acute pain of left shoulder   2.  Paresthesias in left hand   3. Neck sprain, initial encounter   4. Other sprain of left shoulder joint, initial encounter   5. Work related injury     New onset L shoulder and L neck sprain with L upper extremity paresthesias: occurring during work related duties.  Obtain cervical spine films and LEFT shoulder films; both negative for acute processes; degenerative changes present in shoulder and cervical spine.  Treat supportively with rest, stretching, Prednisone, Robaxin.  EKG obtained to rule out cardiac source to L shoulder and L neck pain; no acute process identified.  LIGHT DUTY; no lifting over ten pounds for the next four days.   Return for reevaluation in one week.   Orders Placed This Encounter  Procedures  . DG Cervical Spine Complete    Standing Status:   Future    Number of Occurrences:   1    Standing Expiration Date:   03/19/2019    Order Specific Question:   Reason for Exam (SYMPTOM  OR DIAGNOSIS REQUIRED)    Answer:   L trapezius and neck pain    Order Specific Question:   Preferred imaging location?    Answer:   External  . DG Shoulder Left    Standing Status:   Future    Number of Occurrences:   1    Standing Expiration Date:   03/19/2019    Order Specific Question:   Reason for Exam (SYMPTOM  OR DIAGNOSIS REQUIRED)    Answer:   L shoulder and neck pain    Order Specific Question:   Preferred imaging location?    Answer:   External  . EKG 12-Lead   Meds ordered this encounter  Medications  . methocarbamol (ROBAXIN) 500 MG tablet    Sig: Take 1-2 tablets (500-1,000 mg total) by mouth every 6 (six) hours as needed for muscle spasms.    Dispense:  60 tablet    Refill:  0  . predniSONE (DELTASONE) 20 MG tablet    Sig: Take 1 tablet (20 mg total) by mouth 2 (two) times daily with a meal. For 5 days then one tablet daily x 5 days    Dispense:  15 tablet    Refill:  0    Return in about 1 week (around 03/26/2018) for recheck L shoulder sprain/neck sprain.   Christopher Alicea  [name], M.D. Primary Care at [facility] previously [facility] [address] [place], [place]  [address] [phone] phone [phone] fax

## 2018-03-20 ENCOUNTER — Encounter: Payer: Self-pay | Admitting: *Deleted

## 2018-03-26 ENCOUNTER — Ambulatory Visit (INDEPENDENT_AMBULATORY_CARE_PROVIDER_SITE_OTHER): Payer: Worker's Compensation | Admitting: Family Medicine

## 2018-03-26 ENCOUNTER — Encounter: Payer: Self-pay | Admitting: Family Medicine

## 2018-03-26 ENCOUNTER — Other Ambulatory Visit: Payer: Self-pay

## 2018-03-26 VITALS — BP 102/70 | HR 80 | Temp 98.0°F | Resp 16 | Ht 69.0 in | Wt 238.0 lb

## 2018-03-26 DIAGNOSIS — S43492D Other sprain of left shoulder joint, subsequent encounter: Secondary | ICD-10-CM

## 2018-03-26 DIAGNOSIS — R202 Paresthesia of skin: Secondary | ICD-10-CM

## 2018-03-26 DIAGNOSIS — Y990 Civilian activity done for income or pay: Secondary | ICD-10-CM

## 2018-03-26 DIAGNOSIS — S139XXD Sprain of joints and ligaments of unspecified parts of neck, subsequent encounter: Secondary | ICD-10-CM

## 2018-03-26 DIAGNOSIS — S43492S Other sprain of left shoulder joint, sequela: Secondary | ICD-10-CM

## 2018-03-26 NOTE — Progress Notes (Signed)
Subjective:    Patient ID: Christopher [name], male    DOB: [birth year], 52 y.o.   MRN: [id number]  03/26/2018  Work Related Injury (pt was seen on 4/22 for left shouder injury from work. Pt states the pain and numbness is still present, 1 week follow-up )    HPI This 52 y.o. male presents for ONE WEEK FOLLOW-UP of workman's compensation injury; diagnosed with neck strain and L shoulder sprain last week.  Treated with Prednisone and Methocarbamol.  Placed on light duty; restricted to lifting less than ten pounds.   Still sore and having numbness in LEFT hand still.  Light duty: doing regular duty; has not been doing light duty. Has been trying to cut back on hours; having someone else come in.   No results found. BP Readings from Last 3 Encounters:  03/26/18 102/70  03/19/18 138/82  12/15/17 140/84   Wt Readings from Last 3 Encounters:  03/26/18 238 lb (108 kg)  03/19/18 237 lb (107.5 kg)  12/15/17 246 lb (111.6 kg)     Review of Systems  Constitutional: Negative for chills, diaphoresis, fatigue and fever.  Respiratory: Negative for shortness of breath.   Cardiovascular: Negative for chest pain.  Musculoskeletal: Positive for arthralgias, myalgias, neck pain and neck stiffness. Negative for joint swelling.  Skin: Negative for color change, pallor and wound.  Neurological: Positive for weakness and numbness.         Objective:    BP 102/70   Pulse 80   Temp 98 F (36.7 C) (Oral)   Resp 16   Ht  (1.753 m)   Wt 238 lb (108 kg)   SpO2 95%   BMI 35.15 kg/m  Physical Exam  Constitutional: He is oriented to person, place, and time. He appears well-developed and well-nourished. No distress.  HENT:  Head: Normocephalic and atraumatic.  Eyes: Pupils are equal, round, and reactive to light. Conjunctivae and EOM are normal.  Neck: Normal range of motion. Neck supple. Carotid bruit is not present. No thyromegaly present.  Cardiovascular: Normal rate, regular rhythm,  normal heart sounds and intact distal pulses. Exam reveals no gallop and no friction rub.  No murmur heard. Pulmonary/Chest: Effort normal and breath sounds normal. He has no wheezes. He has no rales.  Musculoskeletal:       Left shoulder: He exhibits decreased range of motion, tenderness, pain, spasm and decreased strength. He exhibits no bony tenderness, no swelling, no effusion, no crepitus, no deformity, no laceration and normal pulse.       Left elbow: Normal. He exhibits normal range of motion, no swelling, no effusion, no deformity and no laceration. No tenderness found. No radial head, no medial epicondyle, no lateral epicondyle and no olecranon process tenderness noted.       Left wrist: Normal. He exhibits normal range of motion, no tenderness, no bony tenderness, no swelling, no effusion, no crepitus, no deformity and no laceration.       Cervical back: He exhibits decreased range of motion, tenderness, bony tenderness, pain and spasm. He exhibits normal pulse.       Left forearm: Normal. He exhibits no tenderness, no bony tenderness, no swelling, no edema and no deformity.       Left hand: Normal. He exhibits normal range of motion, no tenderness, no bony tenderness, normal capillary refill, no deformity, no laceration and no swelling. Normal sensation noted. Normal strength noted.  Lymphadenopathy:    He has no cervical adenopathy.  Neurological: He  is alert and oriented to person, place, and time. No cranial nerve deficit.  Skin: Skin is warm and dry. No rash noted. He is not diaphoretic.  Psychiatric: He has a normal mood and affect. His behavior is normal.  Nursing note and vitals reviewed.  No results found.        Assessment & Plan:   1. Other sprain of left shoulder joint, sequela   2. Neck sprain, subsequent encounter   3. Paresthesias in left hand   4. Work related injury    L shoulder sprain with L neck sprain with persistent L hand paresthesias: Persistent  symptoms; s/p Prednisone taper; taking Robaxin therapy; refer to orthopedics; continue LIGHT DUTY; no lifting greater than ten pounds with either hand/arm.  Continue Robaxin PRN.    Orders Placed This Encounter  Procedures  . Ambulatory referral to Orthopedic Surgery    Referral Priority:   Routine    Referral Type:   Surgical    Referral Reason:   Specialty Services Required    Requested Specialty:   Orthopedic Surgery    Number of Visits Requested:   1   No orders of the defined types were placed in this encounter.   No follow-ups on file.   Christopher [name] Paulita Fujita, M.D. Primary Care at [facility] previously [facility] [address] [place], [place]  [address] [phone] phone [phone] fax

## 2018-03-26 NOTE — Patient Instructions (Addendum)
RECOMMEND TYLENOL EXTRA STRENGTH TWO TABLETS THREE TIMES DAILY.  SAVE MUSCLE RELAXER IN EVENINGS WHEN YOU GET OFF WORK.   IF you received an x-ray today, you will receive an invoice from [facility]. Please contact [facility] at [phone] with questions or concerns regarding your invoice.   IF you received labwork today, you will receive an invoice from [facility]. Please contact [facility] at [phone] with questions or concerns regarding your invoice.   Our billing staff will not be able to assist you with questions regarding bills from these companies.  You will be contacted with the lab results as soon as they are available. The fastest way to get your results is to activate your My Chart account. Instructions are located on the last page of this paperwork. If you have not heard from [place] regarding the results in 2 weeks, please contact this office.      Shoulder Impingement Syndrome Rehab Ask your health care provider which exercises are safe for you. Do exercises exactly as told by your health care provider and adjust them as directed. It is normal to feel mild stretching, pulling, tightness, or discomfort as you do these exercises, but you should stop right away if you feel sudden pain or your pain gets worse.Do not begin these exercises until told by your health care provider. Stretching and range of motion exercise This exercise warms up your muscles and joints and improves the movement and flexibility of your shoulder. This exercise also helps to relieve pain and stiffness. Exercise A: Passive horizontal adduction  1. Sit or stand and pull your left / right elbow across your chest, toward your other shoulder. Stop when you feel a gentle stretch in the back of your shoulder and upper arm. ? Keep your arm at shoulder height. ? Keep your arm as close to your body as you comfortably can. 2. Hold for __________ seconds. 3. Slowly return to the starting  position. Repeat __________ times. Complete this exercise __________ times a day. Strengthening exercises These exercises build strength and endurance in your shoulder. Endurance is the ability to use your muscles for a long time, even after they get tired. Exercise B: External rotation, isometric 1. Stand or sit in a doorway, facing the door frame. 2. Bend your left / right elbow and place the back of your wrist against the door frame. Only your wrist should be touching the frame. Keep your upper arm at your side. 3. Gently press your wrist against the door frame, as if you are trying to push your arm away from your abdomen. ? Avoid shrugging your shoulder while you press your hand against the door frame. Keep your shoulder blade tucked down toward the middle of your back. 4. Hold for __________ seconds. 5. Slowly release the tension, and relax your muscles completely before you do the exercise again. Repeat __________ times. Complete this exercise __________ times a day. Exercise C: Internal rotation, isometric  1. Stand or sit in a doorway, facing the door frame. 2. Bend your left / right elbow and place the inside of your wrist against the door frame. Only your wrist should be touching the frame. Keep your upper arm at your side. 3. Gently press your wrist against the door frame, as if you are trying to push your arm toward your abdomen. ? Avoid shrugging your shoulder while you press your hand against the door frame. Keep your shoulder blade tucked down toward the middle of your back. 4. Hold for __________  seconds. 5. Slowly release the tension, and relax your muscles completely before you do the exercise again. Repeat __________ times. Complete this exercise __________ times a day. Exercise D: Scapular protraction, supine  1. Lie on your back on a firm surface. Hold a __________ weight in your left / right hand. 2. Raise your left / right arm straight into the air so your hand is  directly above your shoulder joint. 3. Push the weight into the air so your shoulder lifts off of the surface that you are lying on. Do not move your head, neck, or back. 4. Hold for __________ seconds. 5. Slowly return to the starting position. Let your muscles relax completely before you repeat this exercise. Repeat __________ times. Complete this exercise __________ times a day. Exercise E: Scapular retraction  1. Sit in a stable chair without armrests, or stand. 2. Secure an exercise band to a stable object in front of you so the band is at shoulder height. 3. Hold one end of the exercise band in each hand. Your palms should face down. 4. Squeeze your shoulder blades together and move your elbows slightly behind you. Do not shrug your shoulders while you do this. 5. Hold for __________ seconds. 6. Slowly return to the starting position. Repeat __________ times. Complete this exercise __________ times a day. Exercise F: Shoulder extension  1. Sit in a stable chair without armrests, or stand. 2. Secure an exercise band to a stable object in front of you where the band is above shoulder height. 3. Hold one end of the exercise band in each hand. 4. Straighten your elbows and lift your hands up to shoulder height. 5. Squeeze your shoulder blades together and pull your hands down to the sides of your thighs. Stop when your hands are straight down by your sides. Do not let your hands go behind your body. 6. Hold for __________ seconds. 7. Slowly return to the starting position. Repeat __________ times. Complete this exercise __________ times a day. This information is not intended to replace advice given to you by your health care provider. Make sure you discuss any questions you have with your health care provider. Document Released: 11/14/2005 Document Revised: 07/21/2016 Document Reviewed: 10/17/2015 Elsevier Interactive Patient Education  [employer].

## 2018-03-27 ENCOUNTER — Telehealth: Payer: Self-pay

## 2018-03-27 NOTE — Telephone Encounter (Signed)
Call ---- 1.  His note states NO LIFTING with LEFT hand until evaluated by orthopedic physician/next office visit.  I do not know when his appointment with ortho will be.  I can see patient again in 1 week while awaiting orthopedic referral.  Please schedule OV with me in one week; can use same day.

## 2018-03-27 NOTE — Telephone Encounter (Signed)
Letter and office visit notes do not state when restriction will end for lifting. No follow-ups listed in notes. Please draft new note with weight restriction and expected time frame for restriction lift.  Copied from CRM [id number]. Topic: Inquiry >> Mar 27, 2018 10:53 AM Diana Eves B wrote: Reason for CRM: Elvia Collum from  [employer] calling to clarify pts work note that states no lifting. He states the previous note stated he could lift 10lbs. He also states it does not state how long he is unable to lift anything . Rob Cline's CB# [phone]

## 2018-03-27 NOTE — Telephone Encounter (Signed)
Revised work letter to clearly explain restrictions to employer.  Please fax to employer.

## 2018-03-27 NOTE — Telephone Encounter (Signed)
Work note faxed to [phone]. Advised Rob to expect updated letter with instructions.

## 2018-03-27 NOTE — Telephone Encounter (Signed)
Letter does not state this. Please see letter below. Katrinka Blazing, ok to change letter based on your recommendation to of evaluation by ortho? OV sent to scheduling pool.   To Whom It May Concern:  It is my medical opinion that Christopher [name] has a work related injury.   Diagnosis:                   1. Other sprain of left shoulder joint, sequela   2. Paresthesias in left hand   3. Work related injury   4. Neck sprain, subsequent encounter    Date of Injury:            03-19-2018 Referral to :                orthopedics Follow up date:         As needed  Restrictions:   [name] can return to work with the following restrictions:  lifting (NO lifting) beginning March 26, 2018.

## 2018-04-23 ENCOUNTER — Encounter: Payer: Self-pay | Admitting: Family Medicine

## 2018-08-02 DIAGNOSIS — Z0271 Encounter for disability determination: Secondary | ICD-10-CM

## 2018-09-03 ENCOUNTER — Encounter (HOSPITAL_COMMUNITY): Payer: Self-pay | Admitting: Emergency Medicine

## 2018-09-03 ENCOUNTER — Emergency Department (HOSPITAL_COMMUNITY): Payer: Self-pay

## 2018-09-03 ENCOUNTER — Emergency Department (HOSPITAL_COMMUNITY)
Admission: EM | Admit: 2018-09-03 | Discharge: 2018-09-03 | Disposition: A | Discharge status: Home / Self Care | Payer: Self-pay | Attending: Emergency Medicine | Admitting: Emergency Medicine

## 2018-09-03 DIAGNOSIS — Z79899 Other long term (current) drug therapy: Secondary | ICD-10-CM | POA: Insufficient documentation

## 2018-09-03 DIAGNOSIS — Z7982 Long term (current) use of aspirin: Secondary | ICD-10-CM | POA: Insufficient documentation

## 2018-09-03 DIAGNOSIS — F17210 Nicotine dependence, cigarettes, uncomplicated: Secondary | ICD-10-CM | POA: Insufficient documentation

## 2018-09-03 DIAGNOSIS — R42 Dizziness and giddiness: Secondary | ICD-10-CM | POA: Insufficient documentation

## 2018-09-03 DIAGNOSIS — R61 Generalized hyperhidrosis: Secondary | ICD-10-CM | POA: Insufficient documentation

## 2018-09-03 DIAGNOSIS — R109 Unspecified abdominal pain: Secondary | ICD-10-CM | POA: Insufficient documentation

## 2018-09-03 DIAGNOSIS — I2510 Atherosclerotic heart disease of native coronary artery without angina pectoris: Secondary | ICD-10-CM | POA: Insufficient documentation

## 2018-09-03 DIAGNOSIS — Z955 Presence of coronary angioplasty implant and graft: Secondary | ICD-10-CM | POA: Insufficient documentation

## 2018-09-03 HISTORY — DX: Disorder of kidney and ureter, unspecified: N28.9

## 2018-09-03 LAB — BASIC METABOLIC PANEL
Anion gap: 6 (ref 5–15)
BUN: 11 mg/dL (ref 6–20)
CHLORIDE: 105 mmol/L (ref 98–111)
CO2: 26 mmol/L (ref 22–32)
Calcium: 9.2 mg/dL (ref 8.9–10.3)
Creatinine, Ser: 0.79 mg/dL (ref 0.61–1.24)
GFR calc Af Amer: >60 mL/min (ref >60)
GFR calc non Af Amer: >60 mL/min (ref >60)
GLUCOSE: 120 mg/dL — AB (ref 70–99)
POTASSIUM: 4.3 mmol/L (ref 3.5–5.1)
Sodium: 137 mmol/L (ref 135–145)

## 2018-09-03 LAB — CBC WITH DIFFERENTIAL/PLATELET
Basophils Absolute: 0 10*3/uL (ref 0.0–0.1)
Basophils Relative: 1 %
EOS PCT: 1 %
Eosinophils Absolute: 0.1 10*3/uL (ref 0.0–0.7)
HCT: 48.5 % (ref 39.0–52.0)
Hemoglobin: 16.9 g/dL (ref 13.0–17.0)
LYMPHS ABS: 1.8 10*3/uL (ref 0.7–4.0)
LYMPHS PCT: 25 %
MCH: 32.1 pg (ref 26.0–34.0)
MCHC: 34.8 g/dL (ref 30.0–36.0)
MCV: 92 fL (ref 78.0–100.0)
MONOS PCT: 10 %
Monocytes Absolute: 0.7 10*3/uL (ref 0.1–1.0)
Neutro Abs: 4.6 10*3/uL (ref 1.7–7.7)
Neutrophils Relative %: 63 %
PLATELETS: 243 10*3/uL (ref 150–400)
RBC: 5.27 MIL/uL (ref 4.22–5.81)
RDW: 13.3 % (ref 11.5–15.5)
WBC: 7.2 10*3/uL (ref 4.0–10.5)

## 2018-09-03 LAB — URINALYSIS, ROUTINE W REFLEX MICROSCOPIC
Bilirubin Urine: NEGATIVE
GLUCOSE, UA: NEGATIVE mg/dL
Hgb urine dipstick: NEGATIVE
Ketones, ur: NEGATIVE mg/dL
LEUKOCYTES UA: NEGATIVE
NITRITE: NEGATIVE
Protein, ur: NEGATIVE mg/dL
SPECIFIC GRAVITY, URINE: 1.005 (ref 1.005–1.030)
pH: 7 (ref 5.0–8.0)

## 2018-09-03 MED ORDER — KETOROLAC TROMETHAMINE 30 MG/ML IJ SOLN
30.0000 mg | Freq: Once | INTRAMUSCULAR | Status: AC
  Administered 2018-09-03: 30 mg via INTRAVENOUS
  Filled 2018-09-03: qty 1

## 2018-09-03 MED ORDER — DICLOFENAC SODIUM 50 MG PO TBEC: 50.0000 mg | DELAYED_RELEASE_TABLET | Freq: Two times a day (BID) | ORAL | 1 refills | Status: DC

## 2018-09-03 MED ORDER — ONDANSETRON HCL 4 MG/2ML IJ SOLN
4.0000 mg | Freq: Once | INTRAMUSCULAR | Status: AC
  Administered 2018-09-03: 4 mg via INTRAVENOUS
  Filled 2018-09-03: qty 2

## 2018-09-03 MED ORDER — KETOROLAC TROMETHAMINE 30 MG/ML IJ SOLN
INTRAMUSCULAR | Status: AC
  Filled 2018-09-03: qty 1

## 2018-09-03 MED ORDER — SODIUM CHLORIDE 0.9 % IV BOLUS
500.0000 mL | Freq: Once | INTRAVENOUS | Status: AC
  Administered 2018-09-03: 500 mL via INTRAVENOUS

## 2018-09-03 MED ORDER — MORPHINE SULFATE (PF) 4 MG/ML IV SOLN
4.0000 mg | Freq: Once | INTRAVENOUS | Status: AC
  Administered 2018-09-03: 4 mg via INTRAVENOUS
  Filled 2018-09-03: qty 1

## 2018-09-03 MED ORDER — CYCLOBENZAPRINE HCL 10 MG PO TABS: 10.0000 mg | ORAL_TABLET | Freq: Two times a day (BID) | ORAL | 0 refills | Status: DC | PRN

## 2018-09-03 NOTE — Discharge Instructions (Addendum)
Your glucose (sugar) was minimally elevated.  Weight loss will help this.  Otherwise, tests were good.  Prescription for muscle relaxer and anti-inflammatory medicine.  Return if worse or follow-up with primary care.

## 2018-09-03 NOTE — ED Provider Notes (Signed)
[facility] EMERGENCY DEPARTMENT Provider Note   CSN: [id number] Arrival date & time: 09/03/18  1214     History   Chief Complaint Chief Complaint  Patient presents with  . Flank Pain    HPI Christopher [name] is a 52 y.o. male.  Pain in right lower back for 3 to 4 weeks intermittently, worse this morning at 8:30 AM with associated diaphoresis and dizziness.  No hematuria, dysuria, vomiting, diarrhea, fever, sweats, chills.  Past medical history includes CAD, cardiac stent, hypercholesterolemia.  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  Severity of pain is mild to moderate.  Nothing makes symptoms better or worse.  Pain is decreased at the moment.     Past Medical History:  Diagnosis Date  . CAD (coronary artery disease)    remote PCI  . Cocaine abuse (HCC)   . Dyslipidemia   . NSTEMI (non-ST elevated myocardial infarction) (HCC) 11/19/2017   in setting of cocaine use  . Renal disorder    kidney stones  . Smoker     Patient Active Problem List   Diagnosis Date Noted  . Cocaine abuse (HCC) 11/20/2017  . Dyslipidemia 11/20/2017  . Smoker 11/20/2017  . NSTEMI (non-ST elevated myocardial infarction) (HCC) 11/17/2017    Past Surgical History:  Procedure Laterality Date  . CORONARY ANGIOPLASTY WITH STENT PLACEMENT          Home Medications    Prior to Admission medications   Medication Sig Start Date End Date Taking? Authorizing Provider  acetaminophen (TYLENOL) 325 MG tablet Take 2 tablets (650 mg total) by mouth every 4 (four) hours as needed for headache or mild pain. 11/20/17   Abelino Derrick, PA-C  aspirin EC 81 MG EC tablet Take 1 tablet (81 mg total) by mouth daily. 11/21/17   Abelino Derrick, PA-C  cyclobenzaprine (FLEXERIL) 10 MG tablet Take 1 tablet (10 mg total) by mouth 2 (two) times daily as needed for muscle spasms. 09/03/18   Donnetta Hutching, MD  diclofenac (VOLTAREN) 50 MG EC tablet Take 1 tablet (50 mg total) by mouth 2 (two) times  daily. 09/03/18   Donnetta Hutching, MD  methocarbamol (ROBAXIN) 500 MG tablet Take 1-2 tablets (500-1,000 mg total) by mouth every 6 (six) hours as needed for muscle spasms. 03/19/18   Ethelda Chick, MD  nitroGLYCERIN (NITROSTAT) 0.4 MG SL tablet Place 1 tablet (0.4 mg total) under the tongue every 5 (five) minutes x 3 doses as needed for chest pain. 11/20/17   Abelino Derrick, PA-C  predniSONE (DELTASONE) 20 MG tablet Take 1 tablet (20 mg total) by mouth 2 (two) times daily with a meal. For 5 days then one tablet daily x 5 days 03/19/18   Ethelda Chick, MD  rosuvastatin (CRESTOR) 10 MG tablet Take 1 tablet (10 mg total) by mouth daily. 12/15/17 03/15/18  Abelino Derrick, PA-C    Family History Family History  Problem Relation Age of Onset  . Coronary artery disease Father 36       CABG    Social History Social History   Tobacco Use  . Smoking status: Current Every Day Smoker    Packs/day: 1.00    Types: Cigarettes  . Smokeless tobacco: Never Used  Substance Use Topics  . Alcohol use: No    Frequency: Never  . Drug use: Yes    Frequency: 1.0 times per week    Types: Cocaine    Comment: past use  Allergies   Lipitor [atorvastatin calcium]   Review of Systems Review of Systems  All other systems reviewed and are negative.    Physical Exam Updated Vital Signs BP 121/85 (BP Location: Right Arm) Comment: correction to VS  Pulse 64   Temp 98.6 F (37 C) (Oral)   Resp 16   Ht 5\' 9"  (1.753 m)   Wt 108.9 kg   SpO2 97%   BMI 35.44 kg/m   Physical Exam  Constitutional: He is oriented to person, place, and time. He appears well-developed and well-nourished.  HENT:  Head: Normocephalic and atraumatic.  Eyes: Conjunctivae are normal.  Neck: Neck supple.  Cardiovascular: Normal rate and regular rhythm.  Pulmonary/Chest: Effort normal and breath sounds normal.  Abdominal: Soft. Bowel sounds are normal.  Musculoskeletal: Normal range of motion.  Tender right lower back.    Neurological: He is alert and oriented to person, place, and time.  Skin: Skin is warm and dry.  Psychiatric: He has a normal mood and affect. His behavior is normal.  Nursing note and vitals reviewed.    ED Treatments / Results  Labs (all labs ordered are listed, but only abnormal results are displayed) Labs Reviewed  URINALYSIS, ROUTINE W REFLEX MICROSCOPIC - Abnormal; Notable for the following components:      Result Value   Color, Urine STRAW (*)    All other components within normal limits  BASIC METABOLIC PANEL - Abnormal; Notable for the following components:   Glucose, Bld 120 (*)    All other components within normal limits  CBC WITH DIFFERENTIAL/PLATELET    EKG None  Radiology Ct Renal Stone Study  Result Date: 09/03/2018 CLINICAL DATA:  Right flank pain EXAM: CT ABDOMEN AND PELVIS WITHOUT CONTRAST TECHNIQUE: Multidetector CT imaging of the abdomen and pelvis was performed following the standard protocol without oral or IV contrast. COMPARISON:  None. FINDINGS: Lower chest: There is bibasilar lung atelectatic change. No lung base edema or consolidation. Hepatobiliary: No focal liver lesions are apparent on this noncontrast enhanced study. Gallbladder wall is not appreciably thickened. There is no biliary duct dilatation. Pancreas: No evident pancreatic mass or inflammatory focus. Spleen: No splenic lesions are evident. A small accessory spleen is noted medial to the spleen. Adrenals/Urinary Tract: Adrenals bilaterally appear normal. Kidneys bilaterally show no evident mass or hydronephrosis on either side. There is no evident renal or ureteral calculus on either side. The urinary bladder is midline with wall thickness within normal limits. Stomach/Bowel: There is no appreciable bowel wall or mesenteric thickening. There is no evident bowel obstruction. No free air or portal venous air. Vascular/Lymphatic: There is no abdominal aortic aneurysm. There is aortic and common iliac  artery atherosclerosis. Major mesenteric arterial vessels appear patent. There is no evident adenopathy in the abdomen or pelvis. Reproductive: Prostate and seminal vesicles appear normal in size and contour. No evident pelvic mass. Other: The appendix appears normal. There is no abscess or ascites in the abdomen or pelvis. There is a small ventral hernia containing only fat. There is fat in each inguinal ring. Musculoskeletal: There are no blastic or lytic bone lesions. No intramuscular lesions are evident. IMPRESSION: 1. A cause for patient's symptoms has not been established with this study. 2. No evident renal or ureteral calculus. No hydronephrosis. Urinary bladder wall thickness normal. 3. No evident bowel obstruction. No abscess in the abdomen or pelvis. Appendix appears normal. 4. Small ventral hernia containing only fat. Fat noted in each inguinal ring. 5.  There are  foci of aortoiliac atherosclerosis. Aortic Atherosclerosis (ICD10-I70.0). Electronically Signed   By: Bretta Bang III M.D.   On: 09/03/2018 14:59    Procedures Procedures (including critical care time)  Medications Ordered in ED Medications  sodium chloride 0.9 % bolus 500 mL (0 mLs Intravenous Stopped 09/03/18 1433)  ondansetron (ZOFRAN) injection 4 mg (4 mg Intravenous Given 09/03/18 1324)  morphine 4 MG/ML injection 4 mg (4 mg Intravenous Given 09/03/18 1324)  ketorolac (TORADOL) 30 MG/ML injection 30 mg (30 mg Intravenous Given 09/03/18 1430)     Initial Impression / Assessment and Plan / ED Course  I have reviewed the triage vital signs and the nursing notes.  Pertinent labs & imaging results that were available during my care of the patient were reviewed by me and considered in my medical decision making (see chart for details).     History and physical could be suggestive of a kidney stone.  Patient is not in extremis.  Labs, urinalysis, CT renal show no acute findings.  Patient is hemodynamically stable at  discharge.  Final Clinical Impressions(s) / ED Diagnoses   Final diagnoses:  Right flank pain    ED Discharge Orders         Ordered    diclofenac (VOLTAREN) 50 MG EC tablet  2 times daily     09/03/18 1557    cyclobenzaprine (FLEXERIL) 10 MG tablet  2 times daily PRN     09/03/18 1557           Donnetta Hutching, MD 09/04/18 1146

## 2018-09-03 NOTE — ED Triage Notes (Signed)
Pt reports right flank pain for about a week with a sudden sharp pain today that made him break out in a sweat.  Also c/o urinary urgency.

## 2018-09-03 NOTE — ED Notes (Signed)
Pt to CT

## 2018-09-03 NOTE — ED Notes (Signed)
Have notified CT that patient's CT should be WITHOUT contrast

## 2018-09-03 NOTE — ED Notes (Signed)
Right sided lower back pain. Does not radiate. Pain more intense after urinating

## 2019-04-29 ENCOUNTER — Other Ambulatory Visit: Payer: Self-pay

## 2019-04-29 ENCOUNTER — Emergency Department (HOSPITAL_COMMUNITY)
Admission: EM | Admit: 2019-04-29 | Discharge: 2019-04-29 | Disposition: A | Discharge status: Home / Self Care | Payer: Self-pay | Attending: Emergency Medicine | Admitting: Emergency Medicine

## 2019-04-29 ENCOUNTER — Encounter (HOSPITAL_COMMUNITY): Payer: Self-pay | Admitting: Emergency Medicine

## 2019-04-29 DIAGNOSIS — Z955 Presence of coronary angioplasty implant and graft: Secondary | ICD-10-CM | POA: Insufficient documentation

## 2019-04-29 DIAGNOSIS — F17210 Nicotine dependence, cigarettes, uncomplicated: Secondary | ICD-10-CM | POA: Insufficient documentation

## 2019-04-29 DIAGNOSIS — Z79899 Other long term (current) drug therapy: Secondary | ICD-10-CM | POA: Insufficient documentation

## 2019-04-29 DIAGNOSIS — I252 Old myocardial infarction: Secondary | ICD-10-CM | POA: Insufficient documentation

## 2019-04-29 DIAGNOSIS — I2510 Atherosclerotic heart disease of native coronary artery without angina pectoris: Secondary | ICD-10-CM | POA: Insufficient documentation

## 2019-04-29 DIAGNOSIS — Z7982 Long term (current) use of aspirin: Secondary | ICD-10-CM | POA: Insufficient documentation

## 2019-04-29 DIAGNOSIS — F1410 Cocaine abuse, uncomplicated: Secondary | ICD-10-CM | POA: Insufficient documentation

## 2019-04-29 DIAGNOSIS — K4091 Unilateral inguinal hernia, without obstruction or gangrene, recurrent: Secondary | ICD-10-CM | POA: Insufficient documentation

## 2019-04-29 MED ORDER — TRAMADOL HCL 50 MG PO TABS: 50.0000 mg | ORAL_TABLET | Freq: Four times a day (QID) | ORAL | 0 refills | Status: DC | PRN

## 2019-04-29 NOTE — ED Provider Notes (Signed)
[facility] EMERGENCY DEPARTMENT Provider Note   CSN: [id number] Arrival date & time: 04/29/19  4259    History   Chief Complaint Chief Complaint  Patient presents with  . Abdominal Pain    HPI Christopher [name] is a 53 y.o. male.     Patient complains of right inguinal pain.  It is worse with movement and cough  The history is provided by the patient.  Abdominal Pain  Pain location:  RLQ Pain quality: aching   Pain radiates to:  Does not radiate Pain severity:  Mild Onset quality:  Sudden Timing:  Intermittent Progression:  Worsening Chronicity:  New Context: not alcohol use   Relieved by:  Nothing Exacerbated by: Cough. Ineffective treatments:  None tried Associated symptoms: no anorexia, no chest pain, no cough, no diarrhea, no fatigue and no hematuria     Past Medical History:  Diagnosis Date  . CAD (coronary artery disease)    remote PCI  . Cocaine abuse (HCC)   . Dyslipidemia   . NSTEMI (non-ST elevated myocardial infarction) (HCC) 11/19/2017   in setting of cocaine use  . Renal disorder    kidney stones  . Smoker     Patient Active Problem List   Diagnosis Date Noted  . Cocaine abuse (HCC) 11/20/2017  . Dyslipidemia 11/20/2017  . Smoker 11/20/2017  . NSTEMI (non-ST elevated myocardial infarction) (HCC) 11/17/2017    Past Surgical History:  Procedure Laterality Date  . CORONARY ANGIOPLASTY WITH STENT PLACEMENT          Home Medications    Prior to Admission medications   Medication Sig Start Date End Date Taking? Authorizing Provider  aspirin EC 81 MG EC tablet Take 1 tablet (81 mg total) by mouth daily. 11/21/17  Yes Kilroy, Eda Paschal, PA-C  acetaminophen (TYLENOL) 325 MG tablet Take 2 tablets (650 mg total) by mouth every 4 (four) hours as needed for headache or mild pain. 11/20/17   Abelino Derrick, PA-C  cyclobenzaprine (FLEXERIL) 10 MG tablet Take 1 tablet (10 mg total) by mouth 2 (two) times daily as needed for muscle spasms. 09/03/18   Donnetta Hutching, MD  diclofenac (VOLTAREN) 50 MG EC tablet Take 1 tablet (50 mg total) by mouth 2 (two) times daily. 09/03/18   Donnetta Hutching, MD  methocarbamol (ROBAXIN) 500 MG tablet Take 1-2 tablets (500-1,000 mg total) by mouth every 6 (six) hours as needed for muscle spasms. 03/19/18   Ethelda Chick, MD  nitroGLYCERIN (NITROSTAT) 0.4 MG SL tablet Place 1 tablet (0.4 mg total) under the tongue every 5 (five) minutes x 3 doses as needed for chest pain. 11/20/17   Abelino Derrick, PA-C  predniSONE (DELTASONE) 20 MG tablet Take 1 tablet (20 mg total) by mouth 2 (two) times daily with a meal. For 5 days then one tablet daily x 5 days 03/19/18   Ethelda Chick, MD  rosuvastatin (CRESTOR) 10 MG tablet Take 1 tablet (10 mg total) by mouth daily. 12/15/17 03/15/18  Abelino Derrick, PA-C  traMADol (ULTRAM) 50 MG tablet Take 1 tablet (50 mg total) by mouth every 6 (six) hours as needed. 04/29/19   Bethann Berkshire, MD    Family History Family History  Problem Relation Age of Onset  . Coronary artery disease Father 75       CABG    Social History Social History   Tobacco Use  . Smoking status: Current Every Day Smoker    Packs/day: 1.00    Types:  Cigarettes  . Smokeless tobacco: Never Used  Substance Use Topics  . Alcohol use: No    Frequency: Never  . Drug use: Yes    Frequency: 1.0 times per week    Types: Cocaine    Comment: past use     Allergies   Lipitor [atorvastatin calcium]   Review of Systems Review of Systems  Constitutional: Negative for appetite change and fatigue.  HENT: Negative for congestion, ear discharge and sinus pressure.   Eyes: Negative for discharge.  Respiratory: Negative for cough.   Cardiovascular: Negative for chest pain.  Gastrointestinal: Positive for abdominal pain. Negative for anorexia and diarrhea.  Genitourinary: Negative for frequency and hematuria.  Musculoskeletal: Negative for back pain.  Skin: Negative for rash.  Neurological: Negative for seizures and  headaches.  Psychiatric/Behavioral: Negative for hallucinations.     Physical Exam Updated Vital Signs BP 133/80 (BP Location: Right Arm)   Pulse 73   Temp 97.9 F (36.6 C) (Oral)   Resp 14   Ht 5\' 9"  (1.753 m)   Wt 111.1 kg   SpO2 97%   BMI 36.18 kg/m   Physical Exam Vitals signs and nursing note reviewed.  Constitutional:      Appearance: He is well-developed.  HENT:     Head: Normocephalic.     Nose: Nose normal.  Eyes:     General: No scleral icterus.    Conjunctiva/sclera: Conjunctivae normal.  Neck:     Musculoskeletal: Neck supple.     Thyroid: No thyromegaly.  Cardiovascular:     Rate and Rhythm: Normal rate and regular rhythm.     Heart sounds: No murmur. No friction rub. No gallop.   Pulmonary:     Breath sounds: No stridor. No wheezing or rales.  Chest:     Chest wall: No tenderness.  Abdominal:     General: There is no distension.     Tenderness: There is abdominal tenderness. There is no rebound.     Comments: Tender right groin consistent with small hernia none incarcerated  Musculoskeletal: Normal range of motion.  Lymphadenopathy:     Cervical: No cervical adenopathy.  Skin:    Findings: No erythema or rash.  Neurological:     Mental Status: He is oriented to person, place, and time.     Motor: No abnormal muscle tone.     Coordination: Coordination normal.  Psychiatric:        Behavior: Behavior normal.      ED Treatments / Results  Labs (all labs ordered are listed, but only abnormal results are displayed) Labs Reviewed - No data to display  EKG None  Radiology No results found.  Procedures Procedures (including critical care time)  Medications Ordered in ED Medications - No data to display   Initial Impression / Assessment and Plan / ED Course  I have reviewed the triage vital signs and the nursing notes.  Pertinent labs & imaging results that were available during my care of the patient were reviewed by me and  considered in my medical decision making (see chart for details).        Patient has a nonincarcerated right inguinal hernia.  He is given some Ultram and will follow-up with general surgery  Final Clinical Impressions(s) / ED Diagnoses   Final diagnoses:  Unilateral recurrent inguinal hernia without obstruction or gangrene    ED Discharge Orders         Ordered    traMADol (ULTRAM) 50 MG tablet  Every 6 hours PRN     04/29/19 0100           Bethann Berkshire, MD 04/29/19 0102

## 2019-04-29 NOTE — Discharge Instructions (Signed)
Follow up with Dr. Lovell Sheehan.  No heavy lifting

## 2019-04-29 NOTE — ED Triage Notes (Signed)
Pt with c/o RLQ abdominal pain x 3 days which he describes as stabbing and sharp when he coughs.

## 2019-05-02 ENCOUNTER — Other Ambulatory Visit: Payer: Self-pay

## 2019-05-16 ENCOUNTER — Ambulatory Visit: Payer: Self-pay | Admitting: General Surgery

## 2020-03-31 ENCOUNTER — Emergency Department (HOSPITAL_COMMUNITY): Payer: Self-pay

## 2020-03-31 ENCOUNTER — Emergency Department (HOSPITAL_COMMUNITY)
Admission: EM | Admit: 2020-03-31 | Discharge: 2020-03-31 | Disposition: A | Discharge status: Home / Self Care | Payer: Self-pay | Attending: Emergency Medicine | Admitting: Emergency Medicine

## 2020-03-31 ENCOUNTER — Other Ambulatory Visit: Payer: Self-pay

## 2020-03-31 ENCOUNTER — Encounter (HOSPITAL_COMMUNITY): Payer: Self-pay | Admitting: Emergency Medicine

## 2020-03-31 DIAGNOSIS — I2510 Atherosclerotic heart disease of native coronary artery without angina pectoris: Secondary | ICD-10-CM | POA: Insufficient documentation

## 2020-03-31 DIAGNOSIS — N451 Epididymitis: Secondary | ICD-10-CM | POA: Insufficient documentation

## 2020-03-31 DIAGNOSIS — N50811 Right testicular pain: Secondary | ICD-10-CM | POA: Insufficient documentation

## 2020-03-31 DIAGNOSIS — Z9861 Coronary angioplasty status: Secondary | ICD-10-CM | POA: Insufficient documentation

## 2020-03-31 DIAGNOSIS — R9389 Abnormal findings on diagnostic imaging of other specified body structures: Secondary | ICD-10-CM | POA: Insufficient documentation

## 2020-03-31 DIAGNOSIS — N433 Hydrocele, unspecified: Secondary | ICD-10-CM | POA: Insufficient documentation

## 2020-03-31 DIAGNOSIS — N5089 Other specified disorders of the male genital organs: Secondary | ICD-10-CM

## 2020-03-31 DIAGNOSIS — F17210 Nicotine dependence, cigarettes, uncomplicated: Secondary | ICD-10-CM | POA: Insufficient documentation

## 2020-03-31 LAB — COMPREHENSIVE METABOLIC PANEL
ALT: 50 U/L — ABNORMAL HIGH (ref 0–44)
AST: 31 U/L (ref 15–41)
Albumin: 4.1 g/dL (ref 3.5–5.0)
Alkaline Phosphatase: 52 U/L (ref 38–126)
Anion gap: 6 (ref 5–15)
BUN: 17 mg/dL (ref 6–20)
CO2: 24 mmol/L (ref 22–32)
Calcium: 9 mg/dL (ref 8.9–10.3)
Chloride: 110 mmol/L (ref 98–111)
Creatinine, Ser: 0.78 mg/dL (ref 0.61–1.24)
GFR calc Af Amer: >60 mL/min (ref >60)
GFR calc non Af Amer: >60 mL/min (ref >60)
Glucose, Bld: 106 mg/dL — ABNORMAL HIGH (ref 70–99)
Potassium: 4.3 mmol/L (ref 3.5–5.1)
Sodium: 140 mmol/L (ref 135–145)
Total Bilirubin: 0.6 mg/dL (ref 0.3–1.2)
Total Protein: 7 g/dL (ref 6.5–8.1)

## 2020-03-31 LAB — CBC WITH DIFFERENTIAL/PLATELET
Abs Immature Granulocytes: 0.03 10*3/uL (ref 0.00–0.07)
Basophils Absolute: 0.1 10*3/uL (ref 0.0–0.1)
Basophils Relative: 1 %
Eosinophils Absolute: 0.1 10*3/uL (ref 0.0–0.5)
Eosinophils Relative: 2 %
HCT: 48.7 % (ref 39.0–52.0)
Hemoglobin: 16.4 g/dL (ref 13.0–17.0)
Immature Granulocytes: 0 %
Lymphocytes Relative: 29 %
Lymphs Abs: 2 10*3/uL (ref 0.7–4.0)
MCH: 31.5 pg (ref 26.0–34.0)
MCHC: 33.7 g/dL (ref 30.0–36.0)
MCV: 93.5 fL (ref 80.0–100.0)
Monocytes Absolute: 0.8 10*3/uL (ref 0.1–1.0)
Monocytes Relative: 12 %
Neutro Abs: 3.8 10*3/uL (ref 1.7–7.7)
Neutrophils Relative %: 56 %
Platelets: 208 10*3/uL (ref 150–400)
RBC: 5.21 MIL/uL (ref 4.22–5.81)
RDW: 13.2 % (ref 11.5–15.5)
WBC: 6.8 10*3/uL (ref 4.0–10.5)
nRBC: 0 % (ref 0.0–0.2)

## 2020-03-31 LAB — URINALYSIS, ROUTINE W REFLEX MICROSCOPIC
Bilirubin Urine: NEGATIVE
Glucose, UA: NEGATIVE mg/dL
Hgb urine dipstick: NEGATIVE
Ketones, ur: NEGATIVE mg/dL
Leukocytes,Ua: NEGATIVE
Nitrite: NEGATIVE
Protein, ur: NEGATIVE mg/dL
Specific Gravity, Urine: 1.019 (ref 1.005–1.030)
pH: 5 (ref 5.0–8.0)

## 2020-03-31 MED ORDER — LEVOFLOXACIN 500 MG PO TABS: 500.0000 mg | ORAL_TABLET | Freq: Every day | ORAL | 0 refills | Status: DC

## 2020-03-31 MED ORDER — NAPROXEN 500 MG PO TABS
500.0000 mg | ORAL_TABLET | Freq: Once | ORAL | Status: AC
  Administered 2020-03-31: 15:00:00 500 mg via ORAL
  Filled 2020-03-31: qty 1

## 2020-03-31 NOTE — ED Notes (Signed)
Pt unable to void, urinal was given. Will continue to monitor

## 2020-03-31 NOTE — Discharge Instructions (Addendum)
Please take antibiotic as prescribed for your suspected infection. Please follow-up with your primary doctor about the small masses noted in your scrotum. You should have an ultrasound repeated in approximately 3 months. If you feel your pain and swelling are worsening, recommend return to ER for reassessment. For pain recommend anti-inflammatory such as naproxen or Motrin.

## 2020-03-31 NOTE — ED Triage Notes (Signed)
Pt reports right flank and right groin pains for a couple months. Has urinary urgency. Reports today noticed swelling to right testicle.

## 2020-03-31 NOTE — ED Provider Notes (Signed)
[facility] DEPT Provider Note   CSN: [id number] Arrival date & time: 03/31/20  4696     History Chief Complaint  Patient presents with  . Groin Swelling    right  . Flank Pain  . Groin Pain    Christopher [name] is a 54 y.o. male.  Presents to ER with concern for right scrotal pain, swelling.  States that he has had some like lower right groin pain for couple months, then this morning noticed some pain and swelling in his right testicle.  Pain is mild to moderate, sharp, stabbing, no alleviating or aggravating factors.  Has not taken pain meds today.  Notable history for CAD, noted cocaine abuse, smoker. HPI     Past Medical History:  Diagnosis Date  . CAD (coronary artery disease)    remote PCI  . Cocaine abuse ([place])   . Dyslipidemia   . NSTEMI (non-ST elevated myocardial infarction) ([place]) 11/19/2017   in setting of cocaine use  . Renal disorder    kidney stones  . Smoker     Patient Active Problem List   Diagnosis Date Noted  . Cocaine abuse ([place]) 11/20/2017  . Dyslipidemia 11/20/2017  . Smoker 11/20/2017  . NSTEMI (non-ST elevated myocardial infarction) ([place]) 11/17/2017    Past Surgical History:  Procedure Laterality Date  . CORONARY ANGIOPLASTY WITH STENT PLACEMENT         Family History  Problem Relation Age of Onset  . Coronary artery disease Father 37       CABG    Social History   Tobacco Use  . Smoking status: Current Every Day Smoker    Packs/day: 1.00    Types: Cigarettes  . Smokeless tobacco: Never Used  Substance Use Topics  . Alcohol use: No  . Drug use: Yes    Frequency: 1.0 times per week    Types: Cocaine    Comment: past use    Home Medications Prior to Admission medications   Medication Sig Start Date End Date Taking? Authorizing Provider  acetaminophen (TYLENOL) 325 MG tablet Take 2 tablets (650 mg total) by mouth every 4 (four) hours as needed for headache or mild pain. 11/20/17  Yes Erlene Quan, PA-C  aspirin EC 81 MG EC tablet Take 1 tablet (81 mg total) by mouth daily. 11/21/17  Yes Kilroy, Luke K, PA-C  nitroGLYCERIN (NITROSTAT) 0.4 MG SL tablet Place 1 tablet (0.4 mg total) under the tongue every 5 (five) minutes x 3 doses as needed for chest pain. 11/20/17  Yes Kilroy, Luke K, PA-C  cyclobenzaprine (FLEXERIL) 10 MG tablet Take 1 tablet (10 mg total) by mouth 2 (two) times daily as needed for muscle spasms. Patient not taking: Reported on 03/31/2020 09/03/18   Nat Christen, MD  diclofenac (VOLTAREN) 50 MG EC tablet Take 1 tablet (50 mg total) by mouth 2 (two) times daily. Patient not taking: Reported on 03/31/2020 09/03/18   Nat Christen, MD  levofloxacin (LEVAQUIN) 500 MG tablet Take 1 tablet (500 mg total) by mouth daily. 03/31/20   Lucrezia Starch, MD  methocarbamol (ROBAXIN) 500 MG tablet Take 1-2 tablets (500-1,000 mg total) by mouth every 6 (six) hours as needed for muscle spasms. Patient not taking: Reported on 03/31/2020 03/19/18   Wardell Honour, MD  predniSONE (DELTASONE) 20 MG tablet Take 1 tablet (20 mg total) by mouth 2 (two) times daily with a meal. For 5 days then one tablet daily x 5 days Patient not taking:  Reported on 03/31/2020 03/19/18   Ethelda Chick, MD  traMADol (ULTRAM) 50 MG tablet Take 1 tablet (50 mg total) by mouth every 6 (six) hours as needed. Patient not taking: Reported on 03/31/2020 04/29/19   Bethann Berkshire, MD    Allergies    Lipitor [atorvastatin calcium]  Review of Systems   Review of Systems  Constitutional: Negative for chills and fever.  HENT: Negative for ear pain and sore throat.   Eyes: Negative for pain and visual disturbance.  Respiratory: Negative for cough and shortness of breath.   Cardiovascular: Negative for chest pain and palpitations.  Gastrointestinal: Negative for abdominal pain and vomiting.  Genitourinary: Positive for scrotal swelling and testicular pain. Negative for dysuria and hematuria.  Musculoskeletal: Negative for  arthralgias and back pain.  Skin: Negative for color change and rash.  Neurological: Negative for seizures and syncope.  All other systems reviewed and are negative.   Physical Exam Updated Vital Signs BP 132/80   Pulse 72   Temp 97.8 F (36.6 C) (Oral)   Resp 16   SpO2 96%   Physical Exam Vitals and nursing note reviewed. Exam conducted with a chaperone present.  Constitutional:      Appearance: He is well-developed.  HENT:     Head: Normocephalic and atraumatic.  Eyes:     Conjunctiva/sclera: Conjunctivae normal.  Cardiovascular:     Rate and Rhythm: Normal rate and regular rhythm.     Heart sounds: No murmur.  Pulmonary:     Effort: Pulmonary effort is normal. No respiratory distress.     Breath sounds: Normal breath sounds.  Abdominal:     Palpations: Abdomen is soft.     Tenderness: There is no abdominal tenderness.  Genitourinary:    Comments: Penis appears normal, left testicle normal, cremasteric reflex intact, no swelling or tenderness, right testicle mild erythema, no palpable deformity or mass, some tenderness to palpation, normal lie, cremasteric reflex intact, no hernia Musculoskeletal:     Cervical back: Neck supple.  Skin:    General: Skin is warm and dry.     Capillary Refill: Capillary refill takes less than 2 seconds.  Neurological:     General: No focal deficit present.     Mental Status: He is alert and oriented to person, place, and time.   RN chaperoned  ED Results / Procedures / Treatments   Labs (all labs ordered are listed, but only abnormal results are displayed) Labs Reviewed  COMPREHENSIVE METABOLIC PANEL - Abnormal; Notable for the following components:      Result Value   Glucose, Bld 106 (*)    ALT 50 (*)    All other components within normal limits  CBC WITH DIFFERENTIAL/PLATELET  URINALYSIS, ROUTINE W REFLEX MICROSCOPIC    EKG None  Radiology US SCROTUM W/DOPPLER  Result Date: 03/31/2020 CLINICAL DATA:  Right scrotal pain  and swelling EXAM: SCROTAL ULTRASOUND DOPPLER ULTRASOUND OF THE TESTICLES TECHNIQUE: Complete ultrasound examination of the testicles, epididymis, and other scrotal structures was performed. Color and spectral Doppler ultrasound were also utilized to evaluate blood flow to the testicles. COMPARISON:  None. FINDINGS: Right testicle Measurements: 3.5 x 1.8 x 2.4 cm. No mass or microlithiasis visualized. Left testicle Measurements: 3.6 x 2.3 x 2.5 cm. No mass or microlithiasis visualized. Right epididymis: There are several subcentimeter cysts arising from the epididymis. Largest of these cysts measures 7 mm. No inflammatory focus is noted in the right epididymis. Left epididymis: Normal in size and appearance. No inflammatory  focus. Hydrocele: There is a moderate debris-filled hydrocele on the right. There is no appreciable hydrocele on the left. Varicocele:  None visualized. Pulsed Doppler interrogation of both testes demonstrates normal low resistance arterial and venous waveforms bilaterally. Several subcentimeter solid masslike areas are noted in the right scrotum apart from the epididymis, ranging in size from 3-5 mm. These small masses do not show significant vascularity. IMPRESSION: 1. There is a moderate sized hydrocele on the right containing debris. This debris could be secondary to hemorrhage or infection. A well-defined abscess is not seen. 2. There are several 3-5 mm solid lesions within the right scrotum which are separate from the epididymis. Etiology for the small masses is uncertain. Statistically, masses of this nature are most likely benign. Adenomatoid type tumors are statistically the most likely etiology for small lesions of this nature. This finding may warrant a follow-up ultrasound of the scrotum in approximately 3 months to assess for stability. 3.  Subcentimeter cysts in the epididymis noted, a benign finding. 4. No intratesticular mass on either side. No orchitis or epididymitis evident on  either side. No demonstrable testicular torsion on either side. Electronically Signed   By: Bretta Bang III M.D.   On: 03/31/2020 11:29    Procedures Procedures (including critical care time)  Medications Ordered in ED Medications  naproxen (NAPROSYN) tablet 500 mg (500 mg Oral Given 03/31/20 1454)    ED Course  I have reviewed the triage vital signs and the nursing notes.  Pertinent labs & imaging results that were available during my care of the patient were reviewed by me and considered in my medical decision making (see chart for details).    MDM Rules/Calculators/A&P                      54 year old male presenting to ER with right testicle pain.  On exam patient is well-appearing, noted some mild tenderness in his right testicle, no obvious deformities noted.  Ultrasound negative for torsion, did demonstrate a hydrocele thought to be secondary to hemorrhage or infection.  No abscess identified.  Also noted were a couple small solid lesions.  Radiologist felt most likely benign but recommended repeat ultrasound in 3 months.  These findings were all discussed in detail with patient, to cover infectious process, epididymitis, etc., will cover with Levaquin.  Patient selected PCP follow-up for outpatient ultrasound in 3 months.  Return for worsening symptoms or swelling.  Labs grossly normal limits, UA negative for infection.  Vitals stable.    After the discussed management above, the patient was determined to be safe for discharge.  The patient was in agreement with this plan and all questions regarding their care were answered.  ED return precautions were discussed and the patient will return to the ED with any significant worsening of condition.    Final Clinical Impression(s) / ED Diagnoses Final diagnoses:  Epididymitis  Scrotal mass  Hydrocele, unspecified hydrocele type    Rx / DC Orders ED Discharge Orders         Ordered    levofloxacin (LEVAQUIN) 500 MG tablet   Daily     03/31/20 1549           Milagros Loll, MD 03/31/20 1732

## 2020-04-02 ENCOUNTER — Emergency Department (HOSPITAL_COMMUNITY)
Admission: EM | Admit: 2020-04-02 | Discharge: 2020-04-03 | Disposition: A | Discharge status: Home / Self Care | Payer: Self-pay | Attending: Emergency Medicine | Admitting: Emergency Medicine

## 2020-04-02 ENCOUNTER — Other Ambulatory Visit: Payer: Self-pay

## 2020-04-02 ENCOUNTER — Encounter (HOSPITAL_COMMUNITY): Payer: Self-pay

## 2020-04-02 ENCOUNTER — Emergency Department (HOSPITAL_COMMUNITY): Payer: Self-pay

## 2020-04-02 DIAGNOSIS — I2510 Atherosclerotic heart disease of native coronary artery without angina pectoris: Secondary | ICD-10-CM | POA: Insufficient documentation

## 2020-04-02 DIAGNOSIS — F17210 Nicotine dependence, cigarettes, uncomplicated: Secondary | ICD-10-CM | POA: Insufficient documentation

## 2020-04-02 DIAGNOSIS — K529 Noninfective gastroenteritis and colitis, unspecified: Secondary | ICD-10-CM | POA: Insufficient documentation

## 2020-04-02 DIAGNOSIS — Z79899 Other long term (current) drug therapy: Secondary | ICD-10-CM | POA: Insufficient documentation

## 2020-04-02 DIAGNOSIS — Z7982 Long term (current) use of aspirin: Secondary | ICD-10-CM | POA: Insufficient documentation

## 2020-04-02 LAB — CBC WITH DIFFERENTIAL/PLATELET
Abs Immature Granulocytes: 0.02 10*3/uL (ref 0.00–0.07)
Basophils Absolute: 0 10*3/uL (ref 0.0–0.1)
Basophils Relative: 1 %
Eosinophils Absolute: 0.2 10*3/uL (ref 0.0–0.5)
Eosinophils Relative: 3 %
HCT: 47.4 % (ref 39.0–52.0)
Hemoglobin: 16 g/dL (ref 13.0–17.0)
Immature Granulocytes: 0 %
Lymphocytes Relative: 28 %
Lymphs Abs: 1.4 10*3/uL (ref 0.7–4.0)
MCH: 30.9 pg (ref 26.0–34.0)
MCHC: 33.8 g/dL (ref 30.0–36.0)
MCV: 91.5 fL (ref 80.0–100.0)
Monocytes Absolute: 0.8 10*3/uL (ref 0.1–1.0)
Monocytes Relative: 17 %
Neutro Abs: 2.5 10*3/uL (ref 1.7–7.7)
Neutrophils Relative %: 51 %
Platelets: 186 10*3/uL (ref 150–400)
RBC: 5.18 MIL/uL (ref 4.22–5.81)
RDW: 13.3 % (ref 11.5–15.5)
WBC: 4.9 10*3/uL (ref 4.0–10.5)
nRBC: 0 % (ref 0.0–0.2)

## 2020-04-02 NOTE — ED Provider Notes (Signed)
[facility] EMERGENCY DEPARTMENT Provider Note   CSN: [id number] Arrival date & time: 04/02/20  2242     History Chief Complaint  Patient presents with  . Abdominal Pain    Christopher [name] is a 54 y.o. male.  Patient presents to the emergency department for evaluation of abdominal pain.  He reports that he is experiencing tightness across his abdomen with distention that has been ongoing for 2 days.  He has not had a bowel movement in 3 days.  He denies nausea and vomiting.  No associated fever.  He was seen at [facility] several days ago for testicular pain and told he had epididymitis but did not pick up the antibiotics.        Past Medical History:  Diagnosis Date  . CAD (coronary artery disease)    remote PCI  . Cocaine abuse ([facility])   . Dyslipidemia   . NSTEMI (non-ST elevated myocardial infarction) ([facility]) 11/19/2017   in setting of cocaine use  . Renal disorder    kidney stones  . Smoker     Patient Active Problem List   Diagnosis Date Noted  . Cocaine abuse ([facility]) 11/20/2017  . Dyslipidemia 11/20/2017  . Smoker 11/20/2017  . NSTEMI (non-ST elevated myocardial infarction) ([facility]) 11/17/2017    Past Surgical History:  Procedure Laterality Date  . CORONARY ANGIOPLASTY WITH STENT PLACEMENT         Family History  Problem Relation Age of Onset  . Coronary artery disease Father 70       CABG    Social History   Tobacco Use  . Smoking status: Current Every Day Smoker    Packs/day: 1.00    Types: Cigarettes  . Smokeless tobacco: Never Used  Substance Use Topics  . Alcohol use: No  . Drug use: Yes    Frequency: 1.0 times per week    Types: Cocaine    Comment: past use    Home Medications Prior to Admission medications   Medication Sig Start Date End Date Taking? Authorizing Provider  acetaminophen (TYLENOL) 325 MG tablet Take 2 tablets (650 mg total) by mouth every 4 (four) hours as needed for headache or mild pain. 11/20/17   Abelino Derrick, PA-C    aspirin EC 81 MG EC tablet Take 1 tablet (81 mg total) by mouth daily. 11/21/17   Abelino Derrick, PA-C  ciprofloxacin (CIPRO) 500 MG tablet Take 1 tablet (500 mg total) by mouth 2 (two) times daily. One po bid x 7 days 04/03/20   Gilda Crease, MD  metroNIDAZOLE (FLAGYL) 500 MG tablet Take 1 tablet (500 mg total) by mouth 2 (two) times daily. One po bid x 7 days 04/03/20   Gilda Crease, MD  nitroGLYCERIN (NITROSTAT) 0.4 MG SL tablet Place 1 tablet (0.4 mg total) under the tongue every 5 (five) minutes x 3 doses as needed for chest pain. 11/20/17   Abelino Derrick, PA-C    Allergies    Lipitor [atorvastatin calcium]  Review of Systems   Review of Systems  Constitutional: Negative for fever.  Cardiovascular: Negative for chest pain.  Gastrointestinal: Positive for abdominal distention, abdominal pain and constipation. Negative for diarrhea, nausea and vomiting.  All other systems reviewed and are negative.   Physical Exam Updated Vital Signs BP 117/86 (BP Location: Left Arm)   Pulse 74   Temp 98.1 F (36.7 C) (Oral)   Resp (!) 21   Ht 5\' 9"  (1.753 m)   Wt 116.1  kg   SpO2 96%   BMI 37.80 kg/m   Physical Exam Vitals and nursing note reviewed.  Constitutional:      General: He is not in acute distress.    Appearance: Normal appearance. He is well-developed.  HENT:     Head: Normocephalic and atraumatic.     Right Ear: Hearing normal.     Left Ear: Hearing normal.     Nose: Nose normal.  Eyes:     Conjunctiva/sclera: Conjunctivae normal.     Pupils: Pupils are equal, round, and reactive to light.  Cardiovascular:     Rate and Rhythm: Regular rhythm.     Heart sounds: S1 normal and S2 normal. No murmur. No friction rub. No gallop.   Pulmonary:     Effort: Pulmonary effort is normal. No respiratory distress.     Breath sounds: Normal breath sounds.  Chest:     Chest wall: No tenderness.  Abdominal:     General: Abdomen is protuberant. Bowel sounds are  decreased.     Palpations: Abdomen is soft.     Tenderness: There is generalized abdominal tenderness. There is no guarding or rebound. Negative signs include Murphy's sign and McBurney's sign.     Hernia: No hernia is present.  Musculoskeletal:        General: Normal range of motion.     Cervical back: Normal range of motion and neck supple.  Skin:    General: Skin is warm and dry.     Findings: No rash.  Neurological:     Mental Status: He is alert and oriented to person, place, and time.     GCS: GCS eye subscore is 4. GCS verbal subscore is 5. GCS motor subscore is 6.     Cranial Nerves: No cranial nerve deficit.     Sensory: No sensory deficit.     Coordination: Coordination normal.  Psychiatric:        Speech: Speech normal.        Behavior: Behavior normal.        Thought Content: Thought content normal.     ED Results / Procedures / Treatments   Labs (all labs ordered are listed, but only abnormal results are displayed) Labs Reviewed  URINALYSIS, ROUTINE W REFLEX MICROSCOPIC - Abnormal; Notable for the following components:      Result Value   Color, Urine STRAW (*)    Hgb urine dipstick SMALL (*)    All other components within normal limits  COMPREHENSIVE METABOLIC PANEL - Abnormal; Notable for the following components:   Glucose, Bld 100 (*)    Calcium 8.6 (*)    ALT 49 (*)    All other components within normal limits  CBC WITH DIFFERENTIAL/PLATELET  LIPASE, BLOOD  TROPONIN I (HIGH SENSITIVITY)    EKG EKG Interpretation  Date/Time:  Thursday Apr 02 2020 23:39:57 EDT Ventricular Rate:  71 PR Interval:    QRS Duration: 111 QT Interval:  372 QTC Calculation: 405 R Axis:   73 Text Interpretation: Sinus rhythm Normal ECG Confirmed by Gilda Crease [phone]) on 04/03/2020 12:09:28 AM   Radiology CT ABDOMEN PELVIS W CONTRAST  Result Date: 04/03/2020 CLINICAL DATA:  54 year old male with abdominal distension. EXAM: CT ABDOMEN AND PELVIS WITH CONTRAST  TECHNIQUE: Multidetector CT imaging of the abdomen and pelvis was performed using the standard protocol following bolus administration of intravenous contrast. CONTRAST:  OMNIPAQUE IOHEXOL 300 MG/ML  SOLN COMPARISON:  CT abdomen pelvis dated 09/03/2018. FINDINGS: Lower chest:  The visualized lung bases are clear. No intra-abdominal free air or free fluid. Hepatobiliary: Diffuse fatty infiltration of the liver. No intrahepatic biliary ductal dilatation. The gallbladder is unremarkable. Pancreas: Unremarkable. No pancreatic ductal dilatation or surrounding inflammatory changes. Spleen: Normal in size without focal abnormality. Adrenals/Urinary Tract: The adrenal glands unremarkable. There is no hydronephrosis on either side. There is symmetric enhancement and excretion of contrast by both kidneys. The visualized ureters and urinary bladder appear unremarkable. Stomach/Bowel: Mild thickened appearance of the loops of proximal small bowel in the upper abdomen, likely reactive. Mild enteritis is less likely but not excluded. Clinical correlation is recommended. No bowel obstruction. Normal appendix. The appendix is normal. Vascular/Lymphatic: Mild aortoiliac atherosclerotic disease. The IVC is unremarkable. No portal venous gas. There is mild haziness of the mesentery with multiple top-normal lymph nodes with a "misty mesentery" appearance. This finding is nonspecific but may be related to underlying inflammatory/infectious etiology. No adenopathy. Reproductive: The prostate and seminal vesicles are grossly unremarkable. Other: Small fat containing umbilical hernia. Musculoskeletal: No acute or significant osseous findings. IMPRESSION: 1. Probable reactive changes versus less likely mild enteritis. Clinical correlation is recommended. No bowel obstruction. Normal appendix. 2. Fatty liver. 3. Aortic Atherosclerosis (ICD10-I70.0). Electronically Signed   By: Anner Crete M.D.   On: 04/03/2020 01:23     Procedures Procedures (including critical care time)  Medications Ordered in ED Medications  iohexol (OMNIPAQUE) 300 MG/ML solution 100 mL (100 mLs Intravenous Contrast Given 04/03/20 0058)    ED Course  I have reviewed the triage vital signs and the nursing notes.  Pertinent labs & imaging results that were available during my care of the patient were reviewed by me and considered in my medical decision making (see chart for details).    MDM Rules/Calculators/A&P                      Patient presents to the emergency department with complaints of abdominal distention and discomfort.  Symptoms ongoing for 2 days.  He has not had a bowel movement for 3 days.  He was recently seen for testicular discomfort and empirically prescribed antibiotics.  Ultrasound at that visit did not show any acute abnormalities.  He has not picked up the antibiotics.  Lab work was unremarkable.  Patient underwent CT scan to further evaluate.  Patient has either mild enteritis or reactive changes seen in the small bowel without evidence of obstruction.  Normal appendix, no other abnormal findings.  No significant constipation.  Patient did not pick up the Levaquin because of cost.  Will change to Cipro which should be significantly cheaper.  This should cover for enteritis as well as epididymitis.  Final Clinical Impression(s) / ED Diagnoses Final diagnoses:  Enteritis    Rx / DC Orders ED Discharge Orders         Ordered    ciprofloxacin (CIPRO) 500 MG tablet  2 times daily     04/03/20 0139    metroNIDAZOLE (FLAGYL) 500 MG tablet  2 times daily     04/03/20 0140           Orpah Greek, MD 04/03/20 0140

## 2020-04-02 NOTE — ED Triage Notes (Signed)
Pt reports abdominal pain and tightness x 2 days.  Pt states he was at [facility] long a couple of days ago and was diagnosed with epididymitis.   Pt states he was supposed to pick up antibiotics, but has not been able to afford it.

## 2020-04-03 LAB — COMPREHENSIVE METABOLIC PANEL
ALT: 49 U/L — ABNORMAL HIGH (ref 0–44)
AST: 31 U/L (ref 15–41)
Albumin: 4 g/dL (ref 3.5–5.0)
Alkaline Phosphatase: 55 U/L (ref 38–126)
Anion gap: 9 (ref 5–15)
BUN: 14 mg/dL (ref 6–20)
CO2: 27 mmol/L (ref 22–32)
Calcium: 8.6 mg/dL — ABNORMAL LOW (ref 8.9–10.3)
Chloride: 99 mmol/L (ref 98–111)
Creatinine, Ser: 0.94 mg/dL (ref 0.61–1.24)
GFR calc Af Amer: >60 mL/min (ref >60)
GFR calc non Af Amer: >60 mL/min (ref >60)
Glucose, Bld: 100 mg/dL — ABNORMAL HIGH (ref 70–99)
Potassium: 4.1 mmol/L (ref 3.5–5.1)
Sodium: 135 mmol/L (ref 135–145)
Total Bilirubin: 0.9 mg/dL (ref 0.3–1.2)
Total Protein: 7.2 g/dL (ref 6.5–8.1)

## 2020-04-03 LAB — URINALYSIS, ROUTINE W REFLEX MICROSCOPIC
Bacteria, UA: NONE SEEN
Bilirubin Urine: NEGATIVE
Glucose, UA: NEGATIVE mg/dL
Ketones, ur: NEGATIVE mg/dL
Leukocytes,Ua: NEGATIVE
Nitrite: NEGATIVE
Protein, ur: NEGATIVE mg/dL
Specific Gravity, Urine: 1.006 (ref 1.005–1.030)
pH: 5 (ref 5.0–8.0)

## 2020-04-03 LAB — TROPONIN I (HIGH SENSITIVITY): Troponin I (High Sensitivity): 4 ng/L (ref <18)

## 2020-04-03 LAB — LIPASE, BLOOD: Lipase: 18 U/L (ref 11–51)

## 2020-04-03 MED ORDER — IOHEXOL 300 MG/ML  SOLN
100.0000 mL | Freq: Once | INTRAMUSCULAR | Status: AC | PRN
  Administered 2020-04-03: 01:00:00 100 mL via INTRAVENOUS

## 2020-04-03 MED ORDER — CIPROFLOXACIN HCL 500 MG PO TABS
500.0000 mg | ORAL_TABLET | Freq: Two times a day (BID) | ORAL | 0 refills | Status: DC
Start: 2020-04-03 — End: 2020-04-03

## 2020-04-03 MED ORDER — CIPROFLOXACIN HCL 500 MG PO TABS
500.0000 mg | ORAL_TABLET | Freq: Two times a day (BID) | ORAL | 0 refills | Status: DC
Start: 2020-04-03 — End: 2020-07-03

## 2020-04-03 MED ORDER — METRONIDAZOLE 500 MG PO TABS
500.0000 mg | ORAL_TABLET | Freq: Two times a day (BID) | ORAL | 0 refills | Status: DC
Start: 2020-04-03 — End: 2021-02-02

## 2020-04-03 NOTE — ED Notes (Signed)
Patient taken to CT scan.

## 2020-04-03 NOTE — ED Notes (Signed)
Patient back from CT scan.

## 2020-07-03 ENCOUNTER — Other Ambulatory Visit: Payer: Self-pay

## 2020-07-03 ENCOUNTER — Ambulatory Visit
Admission: EM | Admit: 2020-07-03 | Discharge: 2020-07-03 | Disposition: A | Discharge status: Home / Self Care | Payer: 59

## 2020-07-03 DIAGNOSIS — G44209 Tension-type headache, unspecified, not intractable: Secondary | ICD-10-CM | POA: Diagnosis not present

## 2020-07-03 DIAGNOSIS — Z0289 Encounter for other administrative examinations: Secondary | ICD-10-CM

## 2020-07-03 NOTE — Discharge Instructions (Signed)
Rest and drink plenty of fluids °Use OTC medications as needed for symptomatic relief °Follow up with PCP if symptoms persists °Return or go to the ER if you have any new or worsening symptoms such as fever, chills, nausea, vomiting, chest pain, shortness of breath, cough, vision changes, worsening headache despite treatment, slurred speech, facial asymmetry, weakness in arms or legs, etc... °

## 2020-07-03 NOTE — ED Triage Notes (Signed)
Pt presents with c/o head ache that is intermittent, needs work note

## 2020-07-03 NOTE — ED Provider Notes (Signed)
[facility]ENTER   [id number] 07/03/20 Arrival Time: 0826  CC: HEADACHE  SUBJECTIVE:  Christopher [name] is a 54 y.o. male who complains of tension headache for the past week, now resolved.  Denies a precipitating event, or recent head trauma.  Patient localized her pain to the base of skull and wraps around towards forehead.  Describes the pain as constant and throbbing in character.  Patient has tried OTC medications with relief. Symptoms was made worse with light.  Reports similar symptoms in the past.  This is not the worst headache of their life.  Patient denies fever, chills, nausea, vomiting, aura, rhinorrhea, watery eyes, chest pain, SOB, abdominal pain, weakness, numbness or tingling, slurred speech.    Missed work on 07/01/20 due to migraine  ROS: As per HPI.  All other pertinent ROS negative.     Past Medical History:  Diagnosis Date  . CAD (coronary artery disease)    remote PCI  . Cocaine abuse (HCC)   . Dyslipidemia   . NSTEMI (non-ST elevated myocardial infarction) (HCC) 11/19/2017   in setting of cocaine use  . Renal disorder    kidney stones  . Smoker    Past Surgical History:  Procedure Laterality Date  . CORONARY ANGIOPLASTY WITH STENT PLACEMENT     Allergies  Allergen Reactions  . Lipitor [Atorvastatin Calcium] Rash    Pt says he had a rash on his arms with Lipitor and stopped it   No current facility-administered medications on file prior to encounter.   Current Outpatient Medications on File Prior to Encounter  Medication Sig Dispense Refill  . acetaminophen (TYLENOL) 325 MG tablet Take 2 tablets (650 mg total) by mouth every 4 (four) hours as needed for headache or mild pain.    Marland Kitchen aspirin EC 81 MG EC tablet Take 1 tablet (81 mg total) by mouth daily.    . metroNIDAZOLE (FLAGYL) 500 MG tablet Take 1 tablet (500 mg total) by mouth 2 (two) times daily. One po bid x 7 days 14 tablet 0  . nitroGLYCERIN (NITROSTAT) 0.4 MG SL tablet Place 1 tablet (0.4 mg total)  under the tongue every 5 (five) minutes x 3 doses as needed for chest pain. 25 tablet 2   Social History   Socioeconomic History  . Marital status: Married    Spouse name: Not on file  . Number of children: Not on file  . Years of education: Not on file  . Highest education level: Not on file  Occupational History  . Not on file  Tobacco Use  . Smoking status: Current Every Day Smoker    Packs/day: 1.00    Types: Cigarettes  . Smokeless tobacco: Never Used  Substance and Sexual Activity  . Alcohol use: No  . Drug use: Yes    Frequency: 1.0 times per week    Types: Cocaine    Comment: past use  . Sexual activity: Not on file  Other Topics Concern  . Not on file  Social History Narrative  . Not on file   Social Determinants of Health   Financial Resource Strain:   . Difficulty of Paying Living Expenses:   Food Insecurity:   . Worried About Programme researcher, broadcasting/film/video in the Last Year:   . Barista in the Last Year:   Transportation Needs:   . Freight forwarder (Medical):   Marland Kitchen Lack of Transportation (Non-Medical):   Physical Activity:   . Days of Exercise per Week:   .  Minutes of Exercise per Session:   Stress:   . Feeling of Stress :   Social Connections:   . Frequency of Communication with Friends and Family:   . Frequency of Social Gatherings with Friends and Family:   . Attends Religious Services:   . Active Member of Clubs or Organizations:   . Attends Banker Meetings:   Marland Kitchen Marital Status:   Intimate Partner Violence:   . Fear of Current or Ex-Partner:   . Emotionally Abused:   Marland Kitchen Physically Abused:   . Sexually Abused:    Family History  Problem Relation Age of Onset  . Coronary artery disease Father 73       CABG    OBJECTIVE:  Vitals:   07/03/20 0846  BP: 137/82  Pulse: 72  Resp: 17  Temp: 97.9 F (36.6 C)  TempSrc: Oral  SpO2: 95%    General appearance: alert; no distress Eyes: PERRLA; EOMI HENT: normocephalic;  atraumatic Neck: supple with FROM Lungs: clear to auscultation bilaterally Heart: regular rate and rhythm.   Extremities: no edema; symmetrical with no gross deformities Skin: warm and dry Neurologic: CN 2-12 grossly intact; finger to nose without difficulty; normal gait; strength and sensation intact bilaterally about the upper and lower extremities; negative pronator drift Psychological: alert and cooperative; normal mood and affect   ASSESSMENT & PLAN:  1. Acute non intractable tension-type headache   2. Encounter to obtain excuse from work    Rest and drink plenty of fluids Use OTC medications as needed for symptomatic relief Follow up with PCP if symptoms persists Return or go to the ER if you have any new or worsening symptoms such as fever, chills, nausea, vomiting, chest pain, shortness of breath, cough, vision changes, worsening headache despite treatment, slurred speech, facial asymmetry, weakness in arms or legs, etc...  Reviewed expectations re: course of current medical issues. Questions answered. Outlined signs and symptoms indicating need for more acute intervention. Patient verbalized understanding. After Visit Summary given.   Rennis Harding, PA-C 07/03/20 [phone]

## 2021-02-02 ENCOUNTER — Emergency Department (HOSPITAL_COMMUNITY)
Admission: EM | Admit: 2021-02-02 | Discharge: 2021-02-02 | Disposition: A | Discharge status: Home / Self Care | Payer: Self-pay | Attending: Emergency Medicine | Admitting: Emergency Medicine

## 2021-02-02 ENCOUNTER — Encounter (HOSPITAL_COMMUNITY): Payer: Self-pay

## 2021-02-02 ENCOUNTER — Emergency Department (HOSPITAL_COMMUNITY): Payer: Self-pay

## 2021-02-02 DIAGNOSIS — I2510 Atherosclerotic heart disease of native coronary artery without angina pectoris: Secondary | ICD-10-CM | POA: Insufficient documentation

## 2021-02-02 DIAGNOSIS — R072 Precordial pain: Secondary | ICD-10-CM | POA: Insufficient documentation

## 2021-02-02 DIAGNOSIS — M79602 Pain in left arm: Secondary | ICD-10-CM | POA: Insufficient documentation

## 2021-02-02 DIAGNOSIS — Z951 Presence of aortocoronary bypass graft: Secondary | ICD-10-CM | POA: Insufficient documentation

## 2021-02-02 DIAGNOSIS — F17210 Nicotine dependence, cigarettes, uncomplicated: Secondary | ICD-10-CM | POA: Insufficient documentation

## 2021-02-02 LAB — BASIC METABOLIC PANEL
Anion gap: 7 (ref 5–15)
BUN: 14 mg/dL (ref 6–20)
CO2: 28 mmol/L (ref 22–32)
Calcium: 9.1 mg/dL (ref 8.9–10.3)
Chloride: 104 mmol/L (ref 98–111)
Creatinine, Ser: 0.96 mg/dL (ref 0.61–1.24)
GFR, Estimated: >60 mL/min (ref >60)
Glucose, Bld: 102 mg/dL — ABNORMAL HIGH (ref 70–99)
Potassium: 4.2 mmol/L (ref 3.5–5.1)
Sodium: 139 mmol/L (ref 135–145)

## 2021-02-02 LAB — CBC
HCT: 47.1 % (ref 39.0–52.0)
Hemoglobin: 16.4 g/dL (ref 13.0–17.0)
MCH: 31.7 pg (ref 26.0–34.0)
MCHC: 34.8 g/dL (ref 30.0–36.0)
MCV: 90.9 fL (ref 80.0–100.0)
Platelets: 244 10*3/uL (ref 150–400)
RBC: 5.18 MIL/uL (ref 4.22–5.81)
RDW: 13 % (ref 11.5–15.5)
WBC: 7.8 10*3/uL (ref 4.0–10.5)
nRBC: 0 % (ref 0.0–0.2)

## 2021-02-02 LAB — TROPONIN I (HIGH SENSITIVITY)
Troponin I (High Sensitivity): 5 ng/L (ref <18)
Troponin I (High Sensitivity): 5 ng/L (ref <18)

## 2021-02-02 NOTE — ED Triage Notes (Signed)
Pt comes via [facility] for CP that started yesterday, radiation to L arm, denies SOB, pain relieved some by one nitro and 324 ASA

## 2021-02-02 NOTE — ED Provider Notes (Signed)
[facility] EMERGENCY DEPARTMENT Provider Note  CSN: [id number] Arrival date & time: 02/02/21 2694  Chief Complaint(s) Chest Pain  HPI Christopher [name] is a 55 y.o. male with a history of obesity presents for evaluation of chest pain. Initial onset of pain was more than 16 hours ago. The patient's chest pain is described as heaviness/pressure/tightness and was worse with exertion. The patient's chest pain is middle- or left-sided, is not well-localized, with sharp components and does associated left arm pain which has been there intermittent prior to chest pain. The patient does not complain of nausea and denies diaphoresis. The patient has smoked in the past 90 days. The patient has no history of stroke, has no history of peripheral artery disease, denies any history of treated diabetes, has no relevant family history of coronary artery disease (first degree relative at less than age 64), is not hypertensive and has no history of hypercholesterolemia.   Of note patient has had a history of NSTEMI related to cocaine abuse in 2018 with subsequent negative stress test 1 month later.   HPI  Past Medical History Past Medical History:  Diagnosis Date  . CAD (coronary artery disease)    remote PCI  . Cocaine abuse (HCC)   . Dyslipidemia   . NSTEMI (non-ST elevated myocardial infarction) (HCC) 11/19/2017   in setting of cocaine use  . Renal disorder    kidney stones  . Smoker    Patient Active Problem List   Diagnosis Date Noted  . Cocaine abuse (HCC) 11/20/2017  . Dyslipidemia 11/20/2017  . Smoker 11/20/2017  . NSTEMI (non-ST elevated myocardial infarction) (HCC) 11/17/2017   Home Medication(s) Prior to Admission medications   Medication Sig Start Date End Date Taking? Authorizing Provider  acetaminophen (TYLENOL) 325 MG tablet Take 2 tablets (650 mg total) by mouth every 4 (four) hours as needed for headache or mild pain. 11/20/17  Yes Kilroy, Eda Paschal, PA-C   acetaminophen (TYLENOL) 650 MG CR tablet Take 1,300 mg by mouth at bedtime.   Yes [provider]  ibuprofen (ADVIL) 200 MG tablet Take 400 mg by mouth every 6 (six) hours as needed for headache or moderate pain.   Yes [provider]  nitroGLYCERIN (NITROSTAT) 0.4 MG SL tablet Place 1 tablet (0.4 mg total) under the tongue every 5 (five) minutes x 3 doses as needed for chest pain. 11/20/17  Yes Abelino Derrick, PA-C                                                                                                                                    Past Surgical History Past Surgical History:  Procedure Laterality Date  . CORONARY ANGIOPLASTY WITH STENT PLACEMENT     Family History Family History  Problem Relation Age of Onset  . Coronary artery disease Father 16       CABG    Social History Social History  Tobacco Use  . Smoking status: Current Every Day Smoker    Packs/day: 1.00    Types: Cigarettes  . Smokeless tobacco: Never Used  Substance Use Topics  . Alcohol use: No  . Drug use: Yes    Frequency: 1.0 times per week    Types: Cocaine    Comment: past use   Allergies Lipitor [atorvastatin calcium]  Review of Systems Review of Systems All other systems are reviewed and are negative for acute change except as noted in the HPI  Physical Exam Vital Signs  I have reviewed the triage vital signs BP 114/66   Pulse (!) 57   Temp 97.6 F (36.4 C) (Oral)   Resp 15   SpO2 95%   Physical Exam Vitals reviewed.  Constitutional:      General: He is not in acute distress.    Appearance: He is well-developed and well-nourished. He is not diaphoretic.  HENT:     Head: Normocephalic and atraumatic.     Nose: Nose normal.  Eyes:     General: No scleral icterus.       Right eye: No discharge.        Left eye: No discharge.     Extraocular Movements: EOM normal.     Conjunctiva/sclera: Conjunctivae normal.     Pupils: Pupils are equal, round, and  reactive to light.  Cardiovascular:     Rate and Rhythm: Normal rate and regular rhythm.     Heart sounds: No murmur heard. No friction rub. No gallop.   Pulmonary:     Effort: Pulmonary effort is normal. No respiratory distress.     Breath sounds: Normal breath sounds. No stridor. No rales.  Abdominal:     General: There is no distension.     Palpations: Abdomen is soft.     Tenderness: There is no abdominal tenderness.  Musculoskeletal:        General: No tenderness or edema.     Cervical back: Normal range of motion and neck supple.  Skin:    General: Skin is warm and dry.     Findings: No erythema or rash.  Neurological:     Mental Status: He is alert and oriented to person, place, and time.  Psychiatric:        Mood and Affect: Mood and affect normal.     ED Results and Treatments Labs (all labs ordered are listed, but only abnormal results are displayed) Labs Reviewed  BASIC METABOLIC PANEL - Abnormal; Notable for the following components:      Result Value   Glucose, Bld 102 (*)    All other components within normal limits  CBC  TROPONIN I (HIGH SENSITIVITY)  TROPONIN I (HIGH SENSITIVITY)                                                                                                                         EKG  EKG Interpretation  Date/Time:  Tuesday February 02 2021 03:06:57  EST Ventricular Rate:  72 PR Interval:  184 QRS Duration: 104 QT Interval:  370 QTC Calculation: 405 R Axis:   78 Text Interpretation: Normal sinus rhythm Normal ECG No acute changes Confirmed by Drema Pryardama, Pedro [phone]([id number]) on 02/02/2021 4:35:02 AM      Radiology DG Chest 2 View  Result Date: 02/02/2021 CLINICAL DATA:  Chest pain EXAM: CHEST - 2 VIEW COMPARISON:  None. FINDINGS: The heart size and mediastinal contours are within normal limits. Both lungs are clear. The visualized skeletal structures are unremarkable. IMPRESSION: No active cardiopulmonary disease. Electronically Signed   By:  Deatra RobinsonKevin  Herman M.D.   On: 02/02/2021 03:45    Pertinent labs & imaging results that were available during my care of the patient were reviewed by me and considered in my medical decision making (see chart for details).  Medications Ordered in ED Medications - No data to display                                                                                                                                  Procedures Procedures  (including critical care time)  Medical Decision Making / ED Course I have reviewed the nursing notes for this encounter and the patient's prior records (if available in EHR or on provided paperwork).   Christopher [name] was evaluated in Emergency Department on 02/02/2021 for the symptoms described in the history of present illness. He was evaluated in the context of the global COVID-19 pandemic, which necessitated consideration that the patient might be at risk for infection with the SARS-CoV-2 virus that causes COVID-19. Institutional protocols and algorithms that pertain to the evaluation of patients at risk for COVID-19 are in a state of rapid change based on information released by regulatory bodies including the CDC and federal and state organizations. These policies and algorithms were followed during the patient's care in the ED.  Patient presents with chest pain. Some typical and atypical features EKG without acute ischemic changes or evidence of pericarditis. Heart score of 4.  Given duration of patient's pain for more than 16 hours, I feel that serial troponins here in the emergency department would be sufficient to rule out ACS. Serial troponins were negative x2.  Presentation not classic for aortic dissection or esophageal perforation. Low suspicion for pulmonary embolism. Chest x-ray without evidence suggestive of pneumonia, pneumothorax, pneumomediastinum.  No abnormal contour of the mediastinum to suggest dissection. No evidence of acute injuries.        Final Clinical Impression(s) / ED Diagnoses Final diagnoses:  Precordial pain   The patient appears reasonably screened and/or stabilized for discharge and I doubt any other medical condition or other [facility] requiring further screening, evaluation, or treatment in the ED at this time prior to discharge. Safe for discharge with strict return precautions.  Disposition: Discharge  Condition: Good  I have discussed the results, Dx and Tx plan with the patient/family who expressed understanding and  agree(s) with the plan. Discharge instructions discussed at length. The patient/family was given strict return precautions who verbalized understanding of the instructions. No further questions at time of discharge.    ED Discharge Orders    None        Follow Up: [facility] [address] [place] [address] [phone] Call  to schedule an appointment for close follow up  Primary care provider  Schedule an appointment as soon as possible for a visit  if you do not have a primary care physician, contact [facility] at [phone] for referral      This chart was dictated using voice recognition software.  Despite best efforts to proofread,  errors can occur which can change the documentation meaning.   Nira Conn, MD 02/02/21 [phone]

## 2021-02-02 NOTE — Discharge Instructions (Addendum)

## 2021-07-05 ENCOUNTER — Encounter (HOSPITAL_COMMUNITY): Payer: Self-pay | Admitting: Emergency Medicine

## 2021-07-05 ENCOUNTER — Observation Stay (HOSPITAL_COMMUNITY)
Admission: EM | Admit: 2021-07-05 | Discharge: 2021-07-06 | Disposition: A | Discharge status: Home / Self Care | Payer: 59 | Attending: Internal Medicine | Admitting: Internal Medicine

## 2021-07-05 ENCOUNTER — Other Ambulatory Visit: Payer: Self-pay

## 2021-07-05 ENCOUNTER — Emergency Department (HOSPITAL_COMMUNITY): Payer: 59

## 2021-07-05 DIAGNOSIS — I214 Non-ST elevation (NSTEMI) myocardial infarction: Secondary | ICD-10-CM | POA: Diagnosis not present

## 2021-07-05 DIAGNOSIS — F17210 Nicotine dependence, cigarettes, uncomplicated: Secondary | ICD-10-CM | POA: Insufficient documentation

## 2021-07-05 DIAGNOSIS — Z20822 Contact with and (suspected) exposure to covid-19: Secondary | ICD-10-CM | POA: Diagnosis not present

## 2021-07-05 DIAGNOSIS — F1410 Cocaine abuse, uncomplicated: Secondary | ICD-10-CM | POA: Diagnosis present

## 2021-07-05 DIAGNOSIS — D72829 Elevated white blood cell count, unspecified: Secondary | ICD-10-CM | POA: Insufficient documentation

## 2021-07-05 DIAGNOSIS — E785 Hyperlipidemia, unspecified: Secondary | ICD-10-CM | POA: Diagnosis present

## 2021-07-05 DIAGNOSIS — F17200 Nicotine dependence, unspecified, uncomplicated: Secondary | ICD-10-CM | POA: Diagnosis present

## 2021-07-05 DIAGNOSIS — R079 Chest pain, unspecified: Secondary | ICD-10-CM | POA: Diagnosis present

## 2021-07-05 DIAGNOSIS — I2510 Atherosclerotic heart disease of native coronary artery without angina pectoris: Secondary | ICD-10-CM | POA: Diagnosis not present

## 2021-07-05 DIAGNOSIS — F1490 Cocaine use, unspecified, uncomplicated: Secondary | ICD-10-CM | POA: Insufficient documentation

## 2021-07-05 DIAGNOSIS — I25119 Atherosclerotic heart disease of native coronary artery with unspecified angina pectoris: Secondary | ICD-10-CM | POA: Diagnosis present

## 2021-07-05 LAB — BASIC METABOLIC PANEL
Anion gap: 10 (ref 5–15)
BUN: 23 mg/dL — ABNORMAL HIGH (ref 6–20)
CO2: 22 mmol/L (ref 22–32)
Calcium: 9.3 mg/dL (ref 8.9–10.3)
Chloride: 108 mmol/L (ref 98–111)
Creatinine, Ser: 1.17 mg/dL (ref 0.61–1.24)
GFR, Estimated: >60 mL/min (ref >60)
Glucose, Bld: 125 mg/dL — ABNORMAL HIGH (ref 70–99)
Potassium: 4.2 mmol/L (ref 3.5–5.1)
Sodium: 140 mmol/L (ref 135–145)

## 2021-07-05 LAB — CBC
HCT: 48.5 % (ref 39.0–52.0)
Hemoglobin: 16.5 g/dL (ref 13.0–17.0)
MCH: 31.4 pg (ref 26.0–34.0)
MCHC: 34 g/dL (ref 30.0–36.0)
MCV: 92.4 fL (ref 80.0–100.0)
Platelets: 258 10*3/uL (ref 150–400)
RBC: 5.25 MIL/uL (ref 4.22–5.81)
RDW: 13.6 % (ref 11.5–15.5)
WBC: 12.3 10*3/uL — ABNORMAL HIGH (ref 4.0–10.5)
nRBC: 0 % (ref 0.0–0.2)

## 2021-07-05 LAB — LIPID PANEL
Cholesterol: 194 mg/dL (ref 0–200)
HDL: 39 mg/dL — ABNORMAL LOW (ref >40)
LDL Cholesterol: 147 mg/dL — ABNORMAL HIGH (ref 0–99)
Total CHOL/HDL Ratio: 5 RATIO
Triglycerides: 40 mg/dL (ref <150)
VLDL: 8 mg/dL (ref 0–40)

## 2021-07-05 LAB — RESP PANEL BY RT-PCR (FLU A&B, COVID) ARPGX2
Influenza A by PCR: NEGATIVE
Influenza B by PCR: NEGATIVE
SARS Coronavirus 2 by RT PCR: NEGATIVE

## 2021-07-05 LAB — HEPARIN LEVEL (UNFRACTIONATED): Heparin Unfractionated: <0.1 IU/mL — ABNORMAL LOW (ref 0.30–0.70)

## 2021-07-05 LAB — HEMOGLOBIN A1C
Hgb A1c MFr Bld: 5.9 % — ABNORMAL HIGH (ref 4.8–5.6)
Mean Plasma Glucose: 122.63 mg/dL

## 2021-07-05 LAB — HIV ANTIBODY (ROUTINE TESTING W REFLEX): HIV Screen 4th Generation wRfx: NONREACTIVE

## 2021-07-05 LAB — TROPONIN I (HIGH SENSITIVITY)
Troponin I (High Sensitivity): 107 ng/L (ref <18)
Troponin I (High Sensitivity): 91 ng/L — ABNORMAL HIGH (ref <18)
Troponin I (High Sensitivity): 74 ng/L — ABNORMAL HIGH (ref <18)

## 2021-07-05 MED ORDER — HEPARIN BOLUS VIA INFUSION
4000.0000 [IU] | Freq: Once | INTRAVENOUS | Status: AC
  Administered 2021-07-05: 4000 [IU] via INTRAVENOUS
  Filled 2021-07-05: qty 4000

## 2021-07-05 MED ORDER — ACETAMINOPHEN 325 MG PO TABS: 650.0000 mg | ORAL_TABLET | Freq: Four times a day (QID) | ORAL | Status: DC | PRN

## 2021-07-05 MED ORDER — SODIUM CHLORIDE 0.9% FLUSH
3.0000 mL | Freq: Two times a day (BID) | INTRAVENOUS | Status: DC
  Administered 2021-07-05: 3 mL via INTRAVENOUS

## 2021-07-05 MED ORDER — ENOXAPARIN SODIUM 40 MG/0.4ML IJ SOSY
40.0000 mg | PREFILLED_SYRINGE | INTRAMUSCULAR | Status: DC
  Administered 2021-07-05: 40 mg via SUBCUTANEOUS
  Filled 2021-07-05: qty 0.4

## 2021-07-05 MED ORDER — HEPARIN SODIUM (PORCINE) 5000 UNIT/ML IJ SOLN: 5000.0000 [IU] | Freq: Once | INTRAMUSCULAR | Status: DC

## 2021-07-05 MED ORDER — ASPIRIN EC 81 MG PO TBEC
81.0000 mg | DELAYED_RELEASE_TABLET | Freq: Every day | ORAL | Status: DC
  Administered 2021-07-06: 81 mg via ORAL

## 2021-07-05 MED ORDER — HEPARIN (PORCINE) 25000 UT/250ML-% IV SOLN: 12.0000 [IU]/kg/h | INTRAVENOUS | Status: DC

## 2021-07-05 MED ORDER — ACETAMINOPHEN 650 MG RE SUPP: 650.0000 mg | Freq: Four times a day (QID) | RECTAL | Status: DC | PRN

## 2021-07-05 MED ORDER — ADULT MULTIVITAMIN W/MINERALS CH
1.0000 | ORAL_TABLET | Freq: Every day | ORAL | Status: DC
  Administered 2021-07-05 – 2021-07-06 (×2): 1 via ORAL
  Filled 2021-07-05: qty 1

## 2021-07-05 MED ORDER — POLYETHYLENE GLYCOL 3350 17 G PO PACK: 17.0000 g | PACK | Freq: Every day | ORAL | Status: DC | PRN

## 2021-07-05 MED ORDER — ROSUVASTATIN CALCIUM 5 MG PO TABS
10.0000 mg | ORAL_TABLET | Freq: Every day | ORAL | Status: DC
  Administered 2021-07-05: 10 mg via ORAL
  Filled 2021-07-05: qty 2

## 2021-07-05 MED ORDER — HEPARIN (PORCINE) 25000 UT/250ML-% IV SOLN
1300.0000 [IU]/h | INTRAVENOUS | Status: DC
  Administered 2021-07-05: 1300 [IU]/h via INTRAVENOUS
  Filled 2021-07-05: qty 250

## 2021-07-05 MED ORDER — LORAZEPAM 1 MG PO TABS
2.0000 mg | ORAL_TABLET | Freq: Once | ORAL | Status: AC
  Administered 2021-07-05: 2 mg via ORAL
  Filled 2021-07-05: qty 2

## 2021-07-05 MED ORDER — LACTATED RINGERS IV BOLUS
1000.0000 mL | Freq: Once | INTRAVENOUS | Status: AC
  Administered 2021-07-05: 1000 mL via INTRAVENOUS

## 2021-07-05 NOTE — ED Notes (Signed)
Pt stated he feels like he's going to pass out. This tech assessed VS and pt is now in a recliner for safety.

## 2021-07-05 NOTE — Discharge Instructions (Signed)
[facility] - No Insurance Required/Free or Reduced Cost/Accepts Medicaid [address] [place], [place] [address] [phone]  [facility] - No Insurance Required/Free or Reduced Cost [address]  [address] [place], [place] [address] [phone]  [facility] - Free Services [address] [place], [place] [address] [phone]  [facility] - Medicaid/Other Insurances [address]  [address] [place], [place] [address] [phone] ext [phone]  [facility] - Medicaid/Self Pay [address] [place], [place] [address] [phone]  [facility] - Medicaid [address] [place], [place] [address] [phone]  [facility] - No Insurance Required/Medicaid/Other Insurances [address] [place], [place] [address] [phone]  [facility] - No Insurance Required/Medicaid/Some Services Free/Other Insurances [address] [place], [place] [address] [phone]  [facility] [address] [place],  [address] [phone]  

## 2021-07-05 NOTE — ED Provider Notes (Signed)
[facility] EMERGENCY DEPARTMENT Provider Note   CSN: [id number] Arrival date & time: 07/05/21  0553     History Chief Complaint  Patient presents with   Chest Pain / Cocaine relapse    Christopher [name] is a 55 y.o. male.  HPI  55 year old male with past medical history of CAD, dyslipidemia, previous NSTEMI in the setting of cocaine use, presenting to the emergency department with chest pain in setting of cocaine use.  Patient reports that he had been 2 years clean, but relapsed yesterday.  He states that he has been smoking crack cocaine essentially nonstop since yesterday.  He states that he developed severe, sharp, left-sided chest pain sometime last night.  It has been constant since its onset.  It radiates to his left arm.  He denies any radiation to his back.  He states it feels like a severely sore muscle and prevents him from taking a deep breath.  He denies any diaphoresis or nausea.  He denies any fever or cough.  Past Medical History:  Diagnosis Date   CAD (coronary artery disease)    remote PCI   Cocaine abuse (HCC)    Dyslipidemia    NSTEMI (non-ST elevated myocardial infarction) (HCC) 11/19/2017   in setting of cocaine use   Renal disorder    kidney stones   Smoker     Patient Active Problem List   Diagnosis Date Noted   Cocaine abuse (HCC) 11/20/2017   Dyslipidemia 11/20/2017   Smoker 11/20/2017   NSTEMI (non-ST elevated myocardial infarction) (HCC) 11/17/2017    Past Surgical History:  Procedure Laterality Date   CORONARY ANGIOPLASTY WITH STENT PLACEMENT         Family History  Problem Relation Age of Onset   Coronary artery disease Father 11       CABG    Social History   Tobacco Use   Smoking status: Every Day    Packs/day: 1.00    Types: Cigarettes   Smokeless tobacco: Never  Substance Use Topics   Alcohol use: No   Drug use: Yes    Frequency: 1.0 times per week    Types: Cocaine    Comment: past use    Home  Medications Prior to Admission medications   Medication Sig Start Date End Date Taking? Authorizing Provider  acetaminophen (TYLENOL) 325 MG tablet Take 2 tablets (650 mg total) by mouth every 4 (four) hours as needed for headache or mild pain. 11/20/17   Abelino Derrick, PA-C  acetaminophen (TYLENOL) 650 MG CR tablet Take 1,300 mg by mouth at bedtime.    [provider]  ibuprofen (ADVIL) 200 MG tablet Take 400 mg by mouth every 6 (six) hours as needed for headache or moderate pain.    [provider]  nitroGLYCERIN (NITROSTAT) 0.4 MG SL tablet Place 1 tablet (0.4 mg total) under the tongue every 5 (five) minutes x 3 doses as needed for chest pain. 11/20/17   Abelino Derrick, PA-C    Allergies    Lipitor [atorvastatin calcium]  Review of Systems   Review of Systems  Constitutional:  Negative for chills and fever.  HENT:  Negative for ear pain and sore throat.   Eyes:  Negative for pain and visual disturbance.  Respiratory:  Negative for cough and shortness of breath.   Cardiovascular:  Positive for chest pain. Negative for palpitations.  Gastrointestinal:  Negative for abdominal pain and vomiting.  Genitourinary:  Negative for dysuria and hematuria.  Musculoskeletal:  Negative for arthralgias and back pain.  Skin:  Negative for color change and rash.  Neurological:  Negative for seizures and syncope.  All other systems reviewed and are negative.  Physical Exam Updated Vital Signs BP 124/69   Pulse 77   Temp 97.7 F (36.5 C) (Oral)   Resp 18   Ht 5\' 9"  (1.753 m)   Wt 113.4 kg   SpO2 94%   BMI 36.92 kg/m   Physical Exam Vitals and nursing note reviewed.  Constitutional:      General: He is not in acute distress.    Appearance: He is well-developed. He is not ill-appearing or toxic-appearing.     Comments: Uncomfortable appearing  HENT:     Head: Normocephalic and atraumatic.  Eyes:     Conjunctiva/sclera: Conjunctivae normal.  Cardiovascular:     Rate  and Rhythm: Normal rate and regular rhythm.     Heart sounds: No murmur heard.    Comments: Markedly tender left pectoral muscle, especially on longer the left lateral sternal border Pulmonary:     Effort: Pulmonary effort is normal. No respiratory distress.     Breath sounds: Normal breath sounds. No stridor. No wheezing, rhonchi or rales.  Abdominal:     Palpations: Abdomen is soft.     Tenderness: There is no abdominal tenderness. There is no guarding or rebound.  Musculoskeletal:     Cervical back: Neck supple.  Skin:    General: Skin is warm and dry.  Neurological:     Mental Status: He is alert.    ED Results / Procedures / Treatments   Labs (all labs ordered are listed, but only abnormal results are displayed) Labs Reviewed  BASIC METABOLIC PANEL - Abnormal; Notable for the following components:      Result Value   Glucose, Bld 125 (*)    BUN 23 (*)    All other components within normal limits  CBC - Abnormal; Notable for the following components:   WBC 12.3 (*)    All other components within normal limits  TROPONIN I (HIGH SENSITIVITY) - Abnormal; Notable for the following components:   Troponin I (High Sensitivity) 107 (*)    All other components within normal limits  RESP PANEL BY RT-PCR (FLU A&B, COVID) ARPGX2  HEPARIN LEVEL (UNFRACTIONATED)  TROPONIN I (HIGH SENSITIVITY)    EKG EKG Interpretation  Date/Time:  Monday July 05 2021 06:03:30 EDT Ventricular Rate:  92 PR Interval:  154 QRS Duration: 98 QT Interval:  358 QTC Calculation: 442 R Axis:   75 Text Interpretation: Normal sinus rhythm Normal ECG When compared with ECG of 02/02/2021, No significant change was found Confirmed by 04/04/2021 (Dione Booze) on 07/05/2021 6:13:40 AM  Radiology DG Chest 2 View  Result Date: 07/05/2021 CLINICAL DATA:  55 year old male with chest pain and dizziness. Presyncope. Smoker. EXAM: CHEST - 2 VIEW COMPARISON:  Chest radiographs 02/02/2021 and earlier. FINDINGS: Semi upright  AP and lateral views of the chest. Stable lung volumes including mild elevation of the left hemidiaphragm since 2018. Normal cardiac size and mediastinal contours. Visualized tracheal air column is within normal limits. No pneumothorax, pulmonary edema, pleural effusion or confluent pulmonary opacity. Stable mild diffuse interstitial markings. No acute osseous abnormality identified. Negative visible bowel gas pattern. IMPRESSION: No acute cardiopulmonary abnormality. Electronically Signed   By: 2019 M.D.   On: 07/05/2021 06:42    Procedures Procedures   Medications Ordered in ED Medications  heparin ADULT infusion 100 units/mL (  25000 units/225mL) (1,300 Units/hr Intravenous New Bag/Given 07/05/21 0827)  LORazepam (ATIVAN) tablet 2 mg (2 mg Oral Given 07/05/21 0820)  heparin bolus via infusion 4,000 Units (4,000 Units Intravenous Bolus from Bag 07/05/21 0827)    ED Course  I have reviewed the triage vital signs and the nursing notes.  Pertinent labs & imaging results that were available during my care of the patient were reviewed by me and considered in my medical decision making (see chart for details).    MDM Rules/Calculators/A&P                           55 year old male with cocaine induced NSTEMI presenting to the emergency department with chest pain in the setting of using cocaine.  Vital signs reviewed, within acceptable limits.  Physical exam is notable for uncomfortable appearing male in no acute distress.  He does not appear clinically intoxicated at this time.  No evidence of mania or psychosis.  Differential includes PE, MSK pain, aortic dissection, ACS, pneumonia, pneumothorax.  EKG without any anatomic ST elevations or ST depressions that would be concerning for acute ischemia.  Chest x-ray without any evidence of pneumothorax or pneumonia.  Given that the patient's pain does not radiate to his back, he has equal pulses, no abnormality on chest x-ray, no concern for aortic  dissection at this time.  The patient's history is less consistent with pulmonary embolism, I do not believe that a D-dimer or CTA PE study is indicated.  Most concerning for ACS.  We will send off troponins.  Already given aspirin by EMS.  Given that he is having active symptoms, will give Ativan.  Initial troponin returned greater than 100, will initiate a heparin infusion and admit the patient to the hospital for likely cocaine induced non-ST elevation myocardial infarction.  Final Clinical Impression(s) / ED Diagnoses Final diagnoses:  NSTEMI (non-ST elevated myocardial infarction) [facility])    Rx / DC Orders ED Discharge Orders     None        Lenard Lance, MD 07/05/21 1116    Gerhard Munch, MD 07/05/21 1536

## 2021-07-05 NOTE — ED Triage Notes (Signed)
Patient [place] motel reports pain across his chest this morning / relapse on Cocaine last week and today , no SOB , denies emesis or diaphoresis , history of CAD/coronary stents , he received ASA 324 mg by EMS prior to arrival . CBG=132.

## 2021-07-05 NOTE — H&P (Addendum)
Date: 07/05/2021               Patient Name:  Christopher [name] MRN: [id number]  DOB: [birth year] Age / Sex: 55 y.o., male   PCP: Patient, No Pcp Per (Inactive)         Medical Service: Internal Medicine Teaching Service         Attending Physician: Dr. Inez Catalina, MD    First Contact: Dr. Park Breed Pager: [phone]  Second Contact: Dr. Huel Cote Pager: [phone]       After Hours (After 5p/  First Contact Pager: [phone]  weekends / holidays): Second Contact Pager: [phone]   Chief Complaint: Chest pain  History of Present Illness:   Christopher [name] is a 55 y/o male with a PMHx of CAD, cocaine-induced NSTEMI who presents to the ED with c/o chest pain.   Christopher [name] states he was in his normal state of health until 8/7 when he began to smoke cocaine.  He notes that he has a history of cocaine use disorder and this is his first relapse in a while.  After using cocaine repeatedly throughout the day, he describes developing dizziness, nausea, diaphoresis, palpitations in addition to substernal chest pain with difficulty breathing around midnight on 8/7.  His chest pain did not radiate anywhere else. Symptoms progressively worsened over a period of 2 hours when he called EMS, who arrived at 5 AM on 8/8.  He believes he may have lost consciousness for short period of time.  At this time, Christopher [name] states his chest pain is improving but still present, in addition to mild difficulty breathing.  His dizziness, nausea, diaphoresis and palpitations have resolved  Christopher [name] endorses frequent tension headaches which he has had for a long time that he takes Aleve for; he estimates that he uses 1 bottle of Aleve (150 tablets) weekly.  His headaches are never unilateral.  He is also been experiencing intermittent abdominal cramps with diarrhea for the last several weeks.  Christopher [name] denies any fever, chills, vomiting, dysuria, hematuria, melena, hematochezia.   ED Course:  On arrival to the ED, patient was  normotensive at 124/75 with heart rate of 95. He was saturating at 100% on room air with respiratory rate of 17. Patient was afebrile. Initial lab work remarkable for an elevated troponin of 107. EKG was obtained and did not show STEMI. CXR negative. Patient was started on a heparin drip. Otherwise, blood work is only remarkable for an elevated WBC of 12.3. IMTS consulted for admission.   Meds:  Daily multivitamin.  Allergies: Allergies as of 07/05/2021 - Review Complete 07/05/2021  Allergen Reaction Noted   Lipitor [atorvastatin calcium] Rash 12/15/2017   Past Medical History:  Diagnosis Date   CAD (coronary artery disease)    remote PCI   Cocaine abuse (HCC)    Dyslipidemia    NSTEMI (non-ST elevated myocardial infarction) (HCC) 11/19/2017   in setting of cocaine use   Renal disorder    kidney stones   Smoker    Family History:  Family History  Problem Relation Age of Onset   Coronary artery disease Father 67       CABG   Social History:  - Christopher [name] lives in [place] with his wife as well as 2 roommates. - History of cocaine use disorder with a current relapse.  He denies any other drug use including heroin and methamphetamine. - He has been using cigarettes since the age of 57 (16  years).  He currently smokes 2 packs/day. - Social alcohol use only - Works in [employer]  Review of Systems: A complete ROS was negative except as per HPI.   Physical Exam: Blood pressure 106/68, pulse 77, temperature 97.7 F (36.5 C), temperature source Oral, resp. rate 13, height 5\' 9"  (1.753 m), weight 113.4 kg, SpO2 96 %.  Physical Exam Vitals and nursing note reviewed.  Constitutional:      General: He is not in acute distress.    Appearance: He is obese.  HENT:     Head: Normocephalic and atraumatic.  Eyes:     Extraocular Movements: Extraocular movements intact.     Pupils: Pupils are equal, round, and reactive to light.  Cardiovascular:     Rate and Rhythm: Normal rate  and regular rhythm.     Pulses:          Radial pulses are 2+ on the right side and 2+ on the left side.     Heart sounds: No murmur heard.   No gallop.  Pulmonary:     Effort: Pulmonary effort is normal. No respiratory distress.     Breath sounds: Normal breath sounds. No wheezing, rhonchi or rales.  Abdominal:     General: Bowel sounds are normal. There is no distension.     Palpations: Abdomen is soft.     Tenderness: There is no abdominal tenderness.  Musculoskeletal:     Right lower leg: Edema (Trace pitting) present.     Left lower leg: Edema (Trace pitting) present.  Skin:    General: Skin is warm and dry.  Neurological:     General: No focal deficit present.     Mental Status: He is alert and oriented to person, place, and time. Mental status is at baseline.     Cranial Nerves: No cranial nerve deficit.     Motor: No weakness.  Psychiatric:        Mood and Affect: Mood normal.        Behavior: Behavior normal.        Thought Content: Thought content normal.        Judgment: Judgment normal.   EKG: personally reviewed my interpretation is: Sinus rhythm with a rate of approximately 90. No axis deviation.  Repolarization changes noted. No ST or T wave changes concerning for ischemia. Compared to prior EKG in March 2022, no changes noted.   CXR: personally reviewed my interpretation is: No pleural effusions or infiltrates. Diffuse interstitial markings noted similar to prior CXR.   Assessment & Plan by Problem: Active Problems:   NSTEMI (non-ST elevated myocardial infarction) [facility])  Christopher [name] is a 55 y/o male with a PMHx of CAD, cocaine-induced NTEMI (2018) who is currently admitted for NSTEMI.   # NSTEMI # Hx of CAD In the setting of cocaine use; likely secondary to cocaine induced vasospasm. Troponins initially elevated, however already trending down. Will hold off on Cardiology consultation or TTE for now.  - Continue heparin drip for now; will likely discontinue if  third troponin continues to decrease - s/p Aspirin 324 mg  - Start daily Aspirin 81 mg on 8/9  - Lipid panel and A1c pending  # Hyperlipidemia  Given hx of CAD, will start a statin today.   - Crestor 10 mg daily - Lipid panel pending   # Cocaine Use Disorder - TOC consult for resources   # Leukocytosis  Likely reactive. No evidence of infection or indication for antibiotics at this time.   -  Repeat CBC tomorrow.   # Tobacco Use Disorder - Given ~ 80 pack year smoking history, he would benefit from CT lung cancer screening. Will defer to the outpatient setting.   Diet: Heart Healthy VTE: Heparin IVF: LR,Bolus Code: Full  Prior to Admission Living Arrangement: Home, living with his wife and 2 room mates Anticipated Discharge Location: Home Barriers to Discharge: Continued medical evaluation   Dispo: Admit patient to Observation with expected length of stay less than 2 midnights.  Signed: Dr. Verdene Lennert Internal Medicine PGY-3 Pager: [phone] After 5pm on weekdays and 1pm on weekends: On Call pager [phone]  07/05/2021, 10:03 AM

## 2021-07-05 NOTE — Social Work (Signed)
CSW consulted for substance abuse counseling. Pt reports that he has been clean for 2 years but has just experienced a relapse of cocaine use (smoked) that lasted 2 days. CAGE-AID score 1. Pt discussed high stress lifestyle including 12 hour work days 6 or 7 days/ week and other life stressors. Pt decline SUD resources but requested OP mental health resources. [facility] resources added to AVS.

## 2021-07-05 NOTE — Progress Notes (Addendum)
ANTICOAGULATION CONSULT NOTE - Initial Consult  Pharmacy Consult for heparin Indication: chest pain/ACS  Allergies  Allergen Reactions   Lipitor [Atorvastatin Calcium] Rash    Pt says he had a rash on his arms with Lipitor and stopped it    Patient Measurements: Height: 5\' 9"  (175.3 cm) Weight: 113.4 kg (250 lb) IBW/kg (Calculated) : 70.7 Heparin Dosing Weight: 96kg  Vital Signs: Temp: 97.7 F (36.5 C) (08/08 0734) Temp Source: Oral (08/08 0734) BP: 121/78 (08/08 0734) Pulse Rate: 81 (08/08 0734)  Labs: Recent Labs    07/05/21 0604  HGB 16.5  HCT 48.5  PLT 258  CREATININE 1.17  TROPONINIHS 107*    Estimated Creatinine Clearance: 88.6 mL/min (by C-G formula based on SCr of 1.17 mg/dL).   Medical History: Past Medical History:  Diagnosis Date   CAD (coronary artery disease)    remote PCI   Cocaine abuse (HCC)    Dyslipidemia    NSTEMI (non-ST elevated myocardial infarction) (HCC) 11/19/2017   in setting of cocaine use   Renal disorder    kidney stones   Smoker     Assessment: 66 YOM presenting with CP, hx of CAD + PCI, he is not on anticoagulation PTA, CBC wnl  Goal of Therapy:  Heparin level 0.3-0.7 units/ml Monitor platelets by anticoagulation protocol: Yes   Plan:  Heparin 4000 units IV x 1, and gtt at 1300 units/hr F/u 6 hour heparin level F/u cards eval and recs  Addendum: Initial level undetectable, however pump channel issues interrupted gtt for long enough to influence level and will plan on repeat to confirm  53, PharmD Clinical Pharmacist ED Pharmacist Phone # [phone] 07/05/2021 7:52 AM

## 2021-07-06 ENCOUNTER — Telehealth: Payer: Self-pay

## 2021-07-06 ENCOUNTER — Other Ambulatory Visit (HOSPITAL_COMMUNITY): Payer: Self-pay

## 2021-07-06 DIAGNOSIS — F17200 Nicotine dependence, unspecified, uncomplicated: Secondary | ICD-10-CM

## 2021-07-06 DIAGNOSIS — I25119 Atherosclerotic heart disease of native coronary artery with unspecified angina pectoris: Secondary | ICD-10-CM | POA: Diagnosis not present

## 2021-07-06 DIAGNOSIS — E785 Hyperlipidemia, unspecified: Secondary | ICD-10-CM

## 2021-07-06 DIAGNOSIS — F1410 Cocaine abuse, uncomplicated: Secondary | ICD-10-CM | POA: Diagnosis not present

## 2021-07-06 LAB — CBG MONITORING, ED: Glucose-Capillary: 145 mg/dL — ABNORMAL HIGH (ref 70–99)

## 2021-07-06 LAB — CBC
HCT: 43.3 % (ref 39.0–52.0)
Hemoglobin: 14.7 g/dL (ref 13.0–17.0)
MCH: 31.3 pg (ref 26.0–34.0)
MCHC: 33.9 g/dL (ref 30.0–36.0)
MCV: 92.3 fL (ref 80.0–100.0)
Platelets: 198 10*3/uL (ref 150–400)
RBC: 4.69 MIL/uL (ref 4.22–5.81)
RDW: 13.7 % (ref 11.5–15.5)
WBC: 7.3 10*3/uL (ref 4.0–10.5)
nRBC: 0 % (ref 0.0–0.2)

## 2021-07-06 LAB — TROPONIN I (HIGH SENSITIVITY)
Troponin I (High Sensitivity): 30 ng/L — ABNORMAL HIGH (ref <18)
Troponin I (High Sensitivity): 28 ng/L — ABNORMAL HIGH (ref <18)

## 2021-07-06 MED ORDER — DICLOFENAC SODIUM 1 % EX GEL
2.0000 g | Freq: Four times a day (QID) | CUTANEOUS | 0 refills | Status: DC
  Filled 2021-07-06: qty 300, 30d supply, fill #0

## 2021-07-06 MED ORDER — NITROGLYCERIN 0.4 MG SL SUBL: 0.4000 mg | SUBLINGUAL_TABLET | SUBLINGUAL | Status: DC | PRN

## 2021-07-06 MED ORDER — ROSUVASTATIN CALCIUM 10 MG PO TABS
10.0000 mg | ORAL_TABLET | Freq: Every day | ORAL | 0 refills | Status: DC
  Filled 2021-07-06: qty 30, 30d supply, fill #0

## 2021-07-06 MED ORDER — DICLOFENAC SODIUM 1 % EX GEL
2.0000 g | Freq: Four times a day (QID) | CUTANEOUS | Status: DC
  Administered 2021-07-06: 2 g via TOPICAL
  Filled 2021-07-06: qty 100

## 2021-07-06 MED ORDER — ASPIRIN 81 MG PO TBEC
81.0000 mg | DELAYED_RELEASE_TABLET | Freq: Every day | ORAL | 0 refills | Status: DC
  Filled 2021-07-06: qty 30, 30d supply, fill #0

## 2021-07-06 MED ORDER — MORPHINE SULFATE (PF) 2 MG/ML IV SOLN
2.0000 mg | Freq: Once | INTRAVENOUS | Status: AC
  Administered 2021-07-06: 2 mg via INTRAVENOUS
  Filled 2021-07-06: qty 1

## 2021-07-06 NOTE — ED Notes (Signed)
Additional information discussed with patient for discharge.  Verbalizes good understanding of new medications and instructions for follow up.  Awaiting ride for discharge.

## 2021-07-06 NOTE — ED Notes (Signed)
Breakfast Orders Placed °

## 2021-07-06 NOTE — ED Notes (Signed)
Provider at bedside to discuss discharge instructions and follow up.  Medications provided to patient from transitions of care team.

## 2021-07-06 NOTE — ED Notes (Signed)
Discharge instructions discussed with patient.  Has further questions for treatment team, message sent to provider.

## 2021-07-06 NOTE — ED Notes (Signed)
Rounding team at bedside °

## 2021-07-06 NOTE — Telephone Encounter (Signed)
Hospital TOC per Dr. Marijo Conception, discharge 07/06/2021, appt 07/15/2021.

## 2021-07-06 NOTE — Discharge Summary (Signed)
Name: Christopher [name] MRN: [id number] DOB: 07-Jun-1966 55 y.o. PCP: Patient, No Pcp Per (Inactive)  Date of Admission: 07/05/2021  6:00 AM Date of Discharge: 07/06/2021 Attending Physician: Inez Catalina, MD  Discharge Diagnosis: 1. NSTEMI 2.Hyperlipidemia 3. Cocaine Use Disorder 4.Leukocytosis 5. Tobacco Use Disorder  Discharge Medications: Allergies as of 07/06/2021       Reactions   Lipitor [atorvastatin Calcium] Rash   Pt says he had a rash on his arms with Lipitor and stopped it        Medication List     STOP taking these medications    naproxen sodium 220 MG tablet Commonly known as: ALEVE       TAKE these medications    acetaminophen 325 MG tablet Commonly known as: TYLENOL Take 2 tablets (650 mg total) by mouth every 4 (four) hours as needed for headache or mild pain.   aspirin 81 MG EC tablet Take 1 tablet (81 mg total) by mouth daily. Swallow whole. Start taking on: July 07, 2021   diclofenac Sodium 1 % Gel Commonly known as: VOLTAREN Apply 2 g topically 4 (four) times daily.   multivitamin tablet Take 1 tablet by mouth daily.   nitroGLYCERIN 0.4 MG SL tablet Commonly known as: NITROSTAT Place 1 tablet (0.4 mg total) under the tongue every 5 (five) minutes x 3 doses as needed for chest pain.   rosuvastatin 10 MG tablet Commonly known as: CRESTOR Take 1 tablet (10 mg total) by mouth daily with supper.        Disposition and follow-up:   [name] was discharged from [facility] in Stable condition.  At the hospital follow up visit please address:  1.  Cocaine Use Disorder: Patient had multiple ED visits for chest pain in setting of cocaine use. Please ensure patient has resources and support for cessation. 2. Hyperlipidemia: Patient initiated on Crestor 10 mg. Please recheck his lipid panel when indicated. 3. Tobacco Use Disorder: Patient has significant history of tobacco use. Please consider low dose CT for cancer  screening.   2.  Labs / imaging needed at time of follow-up: CBC, BMP  3.  Pending labs/ test needing follow-up: None    Follow-up Information     Rollene Rotunda, MD. Schedule an appointment as soon as possible for a visit in 4 week(s).   Specialty: Cardiology Contact information: [address] [place] [address] [phone]                 Hospital Course by problem list: 07/05/2021- Presented to ED with chest pain with recent use of cocaine use. EKG, CXR, Troponin, CBC, BMP, Lipid, A1c, Resp panel were ordered. EKG, CXR were within normal limits. CBC showed reactive leukocytosis that resolved. Troponin was elevated initially but trended downwards. A1c was 5.9 and lipid panel showed elevated LDL at 147. 07/06/2021- No acute events overnight. Patient continued doing well. Did complain of some exertional chest tightness. Repeat Troponin was 30 and EKG was wnl. Cardiology called as patient seen by them in 2018 and they stated no indication for them to see this  patient but would recommend cessation of cocaine to prevent NSTEMI. Patient deemed stable for discharge. Instructions explained and and patient endorsed understanding. Patient was discharged.    Discharge Subjective:  Patient examined at bedside. Reported feeling better but some residual chest pain when walking or turning in bed. Otherwise denied any acute complaints. 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 When asked about his cocaine use stated he does it if there is too much stress. Named several stressful factors in life. Patient stated he has no PCP so recommended getting one set up.   Discharge Exam:   BP 113/77   Pulse 67   Temp 97.7 F (36.5 C) (Oral)   Resp (!) 21   Ht 5\' 9"  (1.753 m)   Wt 113.4 kg   SpO2 96%   BMI 36.92 kg/m   Physical Exam Constitutional:      General: He is not in acute distress.    Appearance: Normal appearance. He is not  ill-appearing.  HENT:     Head: Normocephalic and atraumatic.     Right Ear: External ear normal.     Left Ear: External ear normal.     Mouth/Throat:     Mouth: Mucous membranes are moist.     Pharynx: Oropharynx is clear.  Eyes:     Extraocular Movements: Extraocular movements intact.     Conjunctiva/sclera: Conjunctivae normal.  Cardiovascular:     Rate and Rhythm: Normal rate and regular rhythm.     Pulses: Normal pulses.     Heart sounds: Normal heart sounds.  Pulmonary:     Effort: Pulmonary effort is normal.     Breath sounds: Normal breath sounds.  Abdominal:     General: There is no distension.     Palpations: Abdomen is soft.     Tenderness: There is no abdominal tenderness.  Musculoskeletal:        General: Tenderness (chest tenderness upon palpation) present. No swelling. Normal range of motion.  Skin:    General: Skin is warm and dry.     Findings: No erythema.  Neurological:     General: No focal deficit present.     Mental Status: He is alert and oriented to person, place, and time.  Psychiatric:        Thought Content: Thought content normal.     Comments: Appeared to be stressed and wanted to talk about sources of his stress     Pertinent Labs, Studies, and Procedures:  CBC Latest Ref Rng & Units 07/06/2021 07/05/2021 02/02/2021  WBC 4.0 - 10.5 K/uL 7.3 12.3(H) 7.8  Hemoglobin 13.0 - 17.0 g/dL 04/04/2021 35.4 65.6  Hematocrit 39.0 - 52.0 % 43.3 48.5 47.1  Platelets 150 - 400 K/uL 198 258 244    CMP Latest Ref Rng & Units 07/05/2021 02/02/2021 04/03/2020  Glucose 70 - 99 mg/dL 06/03/2020) 751(Z) 001(V)  BUN 6 - 20 mg/dL 494(W) 14 14  Creatinine 0.61 - 1.24 mg/dL 96(P 5.91 6.38  Sodium 135 - 145 mmol/L 140 139 135  Potassium 3.5 - 5.1 mmol/L 4.2 4.2 4.1  Chloride 98 - 111 mmol/L 108 104 99  CO2 22 - 32 mmol/L 22 28 27   Calcium 8.9 - 10.3 mg/dL 9.3 9.1 4.66)  Total Protein 6.5 - 8.1 g/dL - - 7.2  Total Bilirubin 0.3 - 1.2 mg/dL - - 0.9  Alkaline Phos 38 - 126 U/L - - 55   AST 15 - 41 U/L - - 31  ALT 0 - 44 U/L - - 49(H)    Troponins  1st Troponin 107, 2nd Troponin 91, 3rd Troponin 74  Discharge Troponin 30 EKG in ED: Sinus rhythm with a rate of approximately 90. No axis deviation.  Repolarization changes noted. No ST or T wave changes concerning for ischemia. Compared to prior EKG in March 2022, no changes noted.  EKG at Discharge: sinus rhythm. Rate 64. No ST or T wave changes concerning for ischemia.  CXR: No acute cardiopulmonary abnormality.  Discharge Instructions: Discharge Instructions     Call MD for:  difficulty breathing, headache or visual disturbances   Complete by: As directed    Call MD for:  extreme fatigue   Complete by: As directed    Call MD for:  hives   Complete by: As directed    Call MD for:  persistant dizziness or light-headedness   Complete by: As directed    Call MD for:  persistant nausea and vomiting   Complete by: As directed    Call MD for:  redness, tenderness, or signs of infection (pain, swelling, redness, odor or green/yellow discharge around incision site)   Complete by: As directed    Call MD for:  severe uncontrolled pain   Complete by: As directed    Call MD for:  temperature >100.4   Complete by: As directed    Diet - low sodium heart healthy   Complete by: As directed    Discharge instructions   Complete by: As directed    You presented to the ED with chest pain after consuming cocaine. This happened to you previously in 2018 as well. Your lab work is showing that your heart suffered an injury but is recovering well. It is strongly advised to stop cocaine use as it could result in a heart attack. You mentioned stress as one of the big factors for this use. We will set you up with a PCP and different support for you to discontinue using cocaine. I would advise seeing a PCP and getting referral for individual and family therapy to reduce stress. No need to follow with Cardiology at this time but strong need to  see a primary care provider. I would recommend stopping aleve as you have taking significant amount for your headache. Take tylenol instead of the aleve. If no relieve and you need to take something, please take Excedrin for migraines. Take only amount listed in the instructions. Talk with your PCP about your migraines and to start medication to prevent them from starting. I wish you the best in your road to recovery!   Increase activity slowly   Complete by: As directed        Signed: Gwenevere Abbot, MD 07/06/2021, 11:18 AM   Pager:[phone]

## 2021-07-13 NOTE — Telephone Encounter (Signed)
Transition Care Management Follow-up Telephone Call Date of discharge and from where: Discharged 07/06/21 from the ER. How have you been since you were released from the hospital?  Stated he went back to work on Sunday; but feels like he has no energy. Any questions or concerns? No.  Items Reviewed: Did the pt receive and understand the discharge instructions provided? Yes  Medications obtained and verified? Yes ; stated given to him before he was discharged from the ER.  Any new allergies since your discharge? No  Dietary orders reviewed? Stated he was instructed to watch what he eats d/t elevated cholesterol level. Do you have support at home? Yes ; lives with wife.  Home Care and Equipment/Supplies: Were home health ordered? N/A.  Functional Questionnaire: (I = Independent and D = Dependent) ADLs: I  Bathing/Dressing- I  Meal Prep- I  Eating- I  Maintaining continence- I  Transferring/Ambulation- I  Managing Meds- I  Follow up appointments reviewed:  PCP Hospital f/u appt confirmed? Yes  Scheduled to see Dr Sloan Leiter on Thursday 07/15/21 @ 0945 AM. Specialist Hospital f/u appt confirmed?  N/A Are transportation arrangements needed? No  If their condition worsens, is the pt aware to call PCP or go to the Emergency Dept.? Yes Was the patient provided with contact information for the PCP's office or ED? Yes Was to pt encouraged to call back with questions or concerns? Yes

## 2021-07-14 DIAGNOSIS — R7303 Prediabetes: Secondary | ICD-10-CM | POA: Insufficient documentation

## 2021-07-15 ENCOUNTER — Encounter: Payer: 59 | Admitting: Internal Medicine

## 2021-07-15 DIAGNOSIS — F17200 Nicotine dependence, unspecified, uncomplicated: Secondary | ICD-10-CM

## 2021-07-15 DIAGNOSIS — E785 Hyperlipidemia, unspecified: Secondary | ICD-10-CM

## 2021-07-15 DIAGNOSIS — R7303 Prediabetes: Secondary | ICD-10-CM

## 2021-08-20 ENCOUNTER — Encounter: Payer: Self-pay | Admitting: Emergency Medicine

## 2021-08-20 ENCOUNTER — Ambulatory Visit
Admission: EM | Admit: 2021-08-20 | Discharge: 2021-08-20 | Disposition: A | Discharge status: Home / Self Care | Payer: 59 | Attending: Emergency Medicine | Admitting: Emergency Medicine

## 2021-08-20 ENCOUNTER — Other Ambulatory Visit: Payer: Self-pay

## 2021-08-20 DIAGNOSIS — H1032 Unspecified acute conjunctivitis, left eye: Secondary | ICD-10-CM

## 2021-08-20 DIAGNOSIS — H5789 Other specified disorders of eye and adnexa: Secondary | ICD-10-CM | POA: Diagnosis not present

## 2021-08-20 MED ORDER — POLYMYXIN B-TRIMETHOPRIM 10000-0.1 UNIT/ML-% OP SOLN: 1.0000 [drp] | OPHTHALMIC | 0 refills | Status: DC

## 2021-08-20 NOTE — Discharge Instructions (Signed)
Use polytrim eye drops as prescribed and to completion Use OTC systane or genteal gel eye drops at night as needed for symptomatic relief Use OTC ibuprofen or tylenol as needed for pain relief Return here or follow up with ophthamolgy if symptoms persists or worsen such as fever, chills, redness, swelling, eye pain, painful eye movements, vision changes, etc... 

## 2021-08-20 NOTE — ED Triage Notes (Signed)
Left eye irritation and watering x a few days.

## 2021-08-20 NOTE — ED Provider Notes (Signed)
[facility]   [id number] 08/20/21 Arrival Time: 1817  CC: Red eye  SUBJECTIVE:  Christopher [name] is a 55 y.o. male who presents with complaint of eye redness and irritation, matting, clear drainage, scratchy x 4 days.  Denies a precipitating event, trauma, or close contacts with similar symptoms.  Has tried OTC eye drops without relief.  Symptoms are made worse in the morning.  Denies similar symptoms in the past.  Denies fever, chills, nausea, vomiting, eye pain, painful eye movements, vision changes, FB sensation, periorbital erythema.     Denies contact lens use.    ROS: As per HPI.  All other pertinent ROS negative.     Past Medical History:  Diagnosis Date   CAD (coronary artery disease)    remote PCI   Cocaine abuse (HCC)    Dyslipidemia    NSTEMI (non-ST elevated myocardial infarction) (HCC) 11/19/2017   in setting of cocaine use   Renal disorder    kidney stones   Smoker    Past Surgical History:  Procedure Laterality Date   CORONARY ANGIOPLASTY WITH STENT PLACEMENT     Allergies  Allergen Reactions   Lipitor [Atorvastatin Calcium] Rash    Pt says he had a rash on his arms with Lipitor and stopped it   No current facility-administered medications on file prior to encounter.   Current Outpatient Medications on File Prior to Encounter  Medication Sig Dispense Refill   acetaminophen (TYLENOL) 325 MG tablet Take 2 tablets (650 mg total) by mouth every 4 (four) hours as needed for headache or mild pain.     aspirin 81 MG EC tablet Take 1 tablet (81 mg total) by mouth daily. Swallow whole. 30 tablet 0   diclofenac Sodium (VOLTAREN) 1 % GEL Apply 2 g topically 4 (four) times daily. 300 g 0   Multiple Vitamin (MULTIVITAMIN) tablet Take 1 tablet by mouth daily.     nitroGLYCERIN (NITROSTAT) 0.4 MG SL tablet Place 1 tablet (0.4 mg total) under the tongue every 5 (five) minutes x 3 doses as needed for chest pain. 25 tablet 2   rosuvastatin (CRESTOR) 10 MG tablet Take 1  tablet (10 mg total) by mouth daily with supper. 30 tablet 0   Social History   Socioeconomic History   Marital status: Married    Spouse name: Not on file   Number of children: Not on file   Years of education: Not on file   Highest education level: Not on file  Occupational History   Not on file  Tobacco Use   Smoking status: Every Day    Packs/day: 1.00    Types: Cigarettes   Smokeless tobacco: Never  Substance and Sexual Activity   Alcohol use: No   Drug use: Yes    Frequency: 1.0 times per week    Types: Cocaine    Comment: past use   Sexual activity: Not on file  Other Topics Concern   Not on file  Social History Narrative   Not on file   Social Determinants of Health   Financial Resource Strain: Not on file  Food Insecurity: Not on file  Transportation Needs: Not on file  Physical Activity: Not on file  Stress: Not on file  Social Connections: Not on file  Intimate Partner Violence: Not on file   Family History  Problem Relation Age of Onset   Coronary artery disease Father 70       CABG    OBJECTIVE:   Vitals:  08/20/21 1841  BP: (!) 141/77  Pulse: 76  Resp: 18  Temp: 98.1 F (36.7 C)  TempSrc: Oral  SpO2: 95%    General appearance: alert; no distress Eyes: trace to moderate conjunctival erythema. PERRL; EOMI without discomfort;  mild clear obvious drainage Neck: supple Lungs: normal respiratory effort Skin: warm and dry Psychological: alert and cooperative; normal mood and affect   ASSESSMENT & PLAN:  1. Redness of left eye   2. Acute bacterial conjunctivitis of left eye     Meds ordered this encounter  Medications   trimethoprim-polymyxin b (POLYTRIM) ophthalmic solution    Sig: Place 1 drop into the left eye every 4 (four) hours.    Dispense:  10 mL    Refill:  0    Order Specific Question:   Supervising Provider    Answer:   Eustace Moore [[id number]]    Use polytrim eye drops as prescribed and to completion Use OTC  systane or genteal gel eye drops at night as needed for symptomatic relief Use OTC ibuprofen or tylenol as needed for pain relief Return here or follow up with ophthamolgy if symptoms persists or worsen such as fever, chills, redness, swelling, eye pain, painful eye movements, vision changes, etc...  Reviewed expectations re: course of current medical issues. Questions answered. Outlined signs and symptoms indicating need for more acute intervention. Patient verbalized understanding. After Visit Summary given.    Rennis Harding, PA-C 08/20/21 [phone]

## 2021-11-07 ENCOUNTER — Other Ambulatory Visit: Payer: Self-pay

## 2021-11-07 ENCOUNTER — Emergency Department (HOSPITAL_COMMUNITY): Payer: 59

## 2021-11-07 ENCOUNTER — Emergency Department (HOSPITAL_COMMUNITY)
Admission: EM | Admit: 2021-11-07 | Discharge: 2021-11-07 | Disposition: A | Discharge status: Home / Self Care | Payer: 59 | Attending: Emergency Medicine | Admitting: Emergency Medicine

## 2021-11-07 ENCOUNTER — Encounter (HOSPITAL_COMMUNITY): Payer: Self-pay | Admitting: Emergency Medicine

## 2021-11-07 DIAGNOSIS — Z7982 Long term (current) use of aspirin: Secondary | ICD-10-CM | POA: Insufficient documentation

## 2021-11-07 DIAGNOSIS — R911 Solitary pulmonary nodule: Secondary | ICD-10-CM | POA: Insufficient documentation

## 2021-11-07 DIAGNOSIS — F17210 Nicotine dependence, cigarettes, uncomplicated: Secondary | ICD-10-CM | POA: Insufficient documentation

## 2021-11-07 DIAGNOSIS — R072 Precordial pain: Secondary | ICD-10-CM | POA: Insufficient documentation

## 2021-11-07 DIAGNOSIS — I2510 Atherosclerotic heart disease of native coronary artery without angina pectoris: Secondary | ICD-10-CM | POA: Insufficient documentation

## 2021-11-07 DIAGNOSIS — R079 Chest pain, unspecified: Secondary | ICD-10-CM

## 2021-11-07 DIAGNOSIS — M25512 Pain in left shoulder: Secondary | ICD-10-CM | POA: Insufficient documentation

## 2021-11-07 LAB — CBC WITH DIFFERENTIAL/PLATELET
Abs Immature Granulocytes: 0.04 10*3/uL (ref 0.00–0.07)
Basophils Absolute: 0.1 10*3/uL (ref 0.0–0.1)
Basophils Relative: 1 %
Eosinophils Absolute: 0.2 10*3/uL (ref 0.0–0.5)
Eosinophils Relative: 3 %
HCT: 50.1 % (ref 39.0–52.0)
Hemoglobin: 17.3 g/dL — ABNORMAL HIGH (ref 13.0–17.0)
Immature Granulocytes: 1 %
Lymphocytes Relative: 30 %
Lymphs Abs: 2.5 10*3/uL (ref 0.7–4.0)
MCH: 31.6 pg (ref 26.0–34.0)
MCHC: 34.5 g/dL (ref 30.0–36.0)
MCV: 91.4 fL (ref 80.0–100.0)
Monocytes Absolute: 0.9 10*3/uL (ref 0.1–1.0)
Monocytes Relative: 11 %
Neutro Abs: 4.7 10*3/uL (ref 1.7–7.7)
Neutrophils Relative %: 54 %
Platelets: 260 10*3/uL (ref 150–400)
RBC: 5.48 MIL/uL (ref 4.22–5.81)
RDW: 12.7 % (ref 11.5–15.5)
WBC: 8.4 10*3/uL (ref 4.0–10.5)
nRBC: 0 % (ref 0.0–0.2)

## 2021-11-07 LAB — COMPREHENSIVE METABOLIC PANEL
ALT: 26 U/L (ref 0–44)
AST: 21 U/L (ref 15–41)
Albumin: 4 g/dL (ref 3.5–5.0)
Alkaline Phosphatase: 52 U/L (ref 38–126)
Anion gap: 11 (ref 5–15)
BUN: 19 mg/dL (ref 6–20)
CO2: 24 mmol/L (ref 22–32)
Calcium: 9 mg/dL (ref 8.9–10.3)
Chloride: 102 mmol/L (ref 98–111)
Creatinine, Ser: 1.16 mg/dL (ref 0.61–1.24)
GFR, Estimated: >60 mL/min (ref >60)
Glucose, Bld: 94 mg/dL (ref 70–99)
Potassium: 4.3 mmol/L (ref 3.5–5.1)
Sodium: 137 mmol/L (ref 135–145)
Total Bilirubin: 0.6 mg/dL (ref 0.3–1.2)
Total Protein: 6.8 g/dL (ref 6.5–8.1)

## 2021-11-07 LAB — TROPONIN I (HIGH SENSITIVITY)
Troponin I (High Sensitivity): 4 ng/L (ref <18)
Troponin I (High Sensitivity): 4 ng/L (ref <18)

## 2021-11-07 MED ORDER — DICLOFENAC SODIUM 1 % EX GEL: 2.0000 g | Freq: Four times a day (QID) | CUTANEOUS | 0 refills | Status: DC

## 2021-11-07 MED ORDER — NITROGLYCERIN 0.4 MG SL SUBL: 0.4000 mg | SUBLINGUAL_TABLET | SUBLINGUAL | 0 refills | Status: DC | PRN

## 2021-11-07 NOTE — ED Provider Notes (Signed)
[facility] EMERGENCY DEPARTMENT Provider Note   CSN: [id number] Arrival date & time: 11/07/21  0024     History Chief Complaint  Patient presents with   Chest Pain    Christopher [name] is a 55 y.o. male with a past medical history of coronary artery disease and NSTEMI who presented last night from work due to sternal chest pain that radiated to his left shoulder.  Did not feel like his last heart attacks however did alarm him. Did not have shortness of breath when the chest pain came on.  No dizziness or syncope.  He took aspirin when this happened however has ran out of his nitroglycerin.  Has also been having issues with his shoulder over the last few days.  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      Past Medical History:  Diagnosis Date   CAD (coronary artery disease)    remote PCI   Cocaine abuse (HCC)    Dyslipidemia    NSTEMI (non-ST elevated myocardial infarction) (HCC) 11/19/2017   in setting of cocaine use   Renal disorder    kidney stones   Smoker     Patient Active Problem List   Diagnosis Date Noted   Pre-diabetes 07/14/2021   Chest pain due to CAD (HCC) 07/06/2021   Cocaine abuse (HCC) 11/20/2017   Dyslipidemia 11/20/2017   Smoker 11/20/2017   NSTEMI (non-ST elevated myocardial infarction) (HCC) 11/17/2017    Past Surgical History:  Procedure Laterality Date   CORONARY ANGIOPLASTY WITH STENT PLACEMENT         Family History  Problem Relation Age of Onset   Coronary artery disease Father 52       CABG    Social History   Tobacco Use   Smoking status: Every Day    Packs/day: 1.00    Types: Cigarettes   Smokeless tobacco: Never  Substance Use Topics   Alcohol use: No   Drug use: Yes    Frequency: 1.0 times per week    Types: Cocaine    Comment: past use    Home Medications Prior to Admission medications   Medication Sig Start Date End Date Taking? Authorizing  Provider  acetaminophen (TYLENOL) 325 MG tablet Take 2 tablets (650 mg total) by mouth every 4 (four) hours as needed for headache or mild pain. 11/20/17   Abelino Derrick, PA-C  aspirin 81 MG EC tablet Take 1 tablet (81 mg total) by mouth daily. Swallow whole. 07/07/21   Evlyn Kanner, MD  diclofenac Sodium (VOLTAREN) 1 % GEL Apply 2 g topically 4 (four) times daily. 11/07/21   Mycala Warshawsky A, PA-C  Multiple Vitamin (MULTIVITAMIN) tablet Take 1 tablet by mouth daily.    [provider]  nitroGLYCERIN (NITROSTAT) 0.4 MG SL tablet Place 1 tablet (0.4 mg total) under the tongue every 5 (five) minutes x 3 doses as needed for chest pain. 11/07/21   Carlee Tesfaye A, PA-C  rosuvastatin (CRESTOR) 10 MG tablet Take 1 tablet (10 mg total) by mouth daily with supper. 07/06/21   Evlyn Kanner, MD  trimethoprim-polymyxin b (POLYTRIM) ophthalmic solution Place 1 drop into the left eye every 4 (four) hours. 08/20/21   Wurst, [place], PA-C    Allergies    Lipitor [atorvastatin calcium]  Review of Systems   Review of Systems  Constitutional:  Negative for diaphoresis.  Respiratory:  Negative for chest tightness, shortness of breath and wheezing.  Cardiovascular:  Positive for chest pain. Negative for palpitations.  Gastrointestinal:  Negative for nausea and vomiting.  Neurological:  Negative for dizziness and syncope.  All other systems reviewed and are negative.  Physical Exam Updated Vital Signs BP 118/85 (BP Location: Right Arm)   Pulse 83   Temp 97.9 F (36.6 C) (Oral)   Resp 18   SpO2 99%   Physical Exam Vitals and nursing note reviewed.  Constitutional:      General: He is not in acute distress.    Appearance: Normal appearance. He is not ill-appearing.  HENT:     Head: Normocephalic and atraumatic.  Eyes:     General: No scleral icterus.    Conjunctiva/sclera: Conjunctivae normal.  Cardiovascular:     Rate and Rhythm: Normal rate and regular rhythm.  Pulmonary:      Effort: Pulmonary effort is normal. No respiratory distress.     Breath sounds: Normal breath sounds. No decreased breath sounds.  Abdominal:     General: Bowel sounds are normal.     Palpations: Abdomen is soft.  Skin:    General: Skin is warm and dry.     Findings: No rash.  Neurological:     Mental Status: He is alert.  Psychiatric:        Mood and Affect: Mood normal.    ED Results / Procedures / Treatments   Labs (all labs ordered are listed, but only abnormal results are displayed) Labs Reviewed  CBC WITH DIFFERENTIAL/PLATELET - Abnormal; Notable for the following components:      Result Value   Hemoglobin 17.3 (*)    All other components within normal limits  COMPREHENSIVE METABOLIC PANEL  TROPONIN I (HIGH SENSITIVITY)  TROPONIN I (HIGH SENSITIVITY)    EKG None  Radiology DG Chest 2 View  Result Date: 11/07/2021 CLINICAL DATA:  Chest pain.  Pain in left arm. EXAM: CHEST - 2 VIEW; LEFT SHOULDER - 2+ VIEW COMPARISON:  07/05/2021. FINDINGS: The cardiomediastinal silhouette is within normal limits. No consolidation, effusion, or pneumothorax. A 1.1 cm nodule is present at the left lung base. No acute fracture or dislocation is present at the left shoulder. Mild degenerative changes are noted at the acromioclavicular and glenohumeral joints. IMPRESSION: 1. No acute cardiopulmonary process. 2. 1.1 cm nodule at the left lung base. CT is suggested for further evaluation on follow-up. 3. Mild degenerative changes at the acromioclavicular and glenohumeral joints on the left. Electronically Signed   By: Brett Fairy M.D.   On: 11/07/2021 01:35   DG Shoulder Left  Result Date: 11/07/2021 CLINICAL DATA:  Chest pain.  Pain in left arm. EXAM: CHEST - 2 VIEW; LEFT SHOULDER - 2+ VIEW COMPARISON:  07/05/2021. FINDINGS: The cardiomediastinal silhouette is within normal limits. No consolidation, effusion, or pneumothorax. A 1.1 cm nodule is present at the left lung base. No acute  fracture or dislocation is present at the left shoulder. Mild degenerative changes are noted at the acromioclavicular and glenohumeral joints. IMPRESSION: 1. No acute cardiopulmonary process. 2. 1.1 cm nodule at the left lung base. CT is suggested for further evaluation on follow-up. 3. Mild degenerative changes at the acromioclavicular and glenohumeral joints on the left. Electronically Signed   By: Brett Fairy M.D.   On: 11/07/2021 01:35    Procedures Procedures   Medications Ordered in ED Medications - No data to display  ED Course  I have reviewed the triage vital signs and the nursing notes.  Pertinent labs & imaging results  that were available during my care of the patient were reviewed by me and considered in my medical decision making (see chart for details).    MDM Rules/Calculators/A&P The emergent differential diagnosis of chest pain includes: Acute coronary syndrome, pericarditis, aortic dissection, pulmonary embolism, tension pneumothorax, and esophageal rupture.  All of these were considered throughout the evaluation of the patient.  There was originally concern for ACS due to his history, and chest pain that radiated to his shoulder.  Normal chest pain work-up was initiated.  EKG normal sinus, chest x-ray without pneumonia however does reveal a small nodule.  Patient reports that he smokes about a pack a day.  Troponin negative x2.  Low suspicion for ACS at this time.  He reports that he is no longer having pain after being in the waiting room for a long period of time.  He is requesting a refill on his nitroglycerin as well as Voltaren gel for his shoulder.  I have sent these to his pharmacy.  He has also been given referrals to pulmonology, orthopedics and primary care for follow-up.  Final Clinical Impression(s) / ED Diagnoses Final diagnoses:  Chest pain, unspecified type  Lung nodule seen on imaging study    Rx / DC Orders ED Discharge Orders          Ordered     nitroGLYCERIN (NITROSTAT) 0.4 MG SL tablet  Every 5 min x3 PRN        11/07/21 0745    diclofenac Sodium (VOLTAREN) 1 % GEL  4 times daily        11/07/21 0745           Results and diagnoses were explained to the patient. Return precautions discussed in full. Patient had no additional questions and expressed complete understanding.    Saddie Benders, PA-C 11/07/21 1275    Gwyneth Sprout, MD 11/07/21 1450

## 2021-11-07 NOTE — ED Provider Notes (Signed)
Emergency Medicine Provider Triage Evaluation Note  Christopher [name] , a 55 y.o. male  was evaluated in triage.  Pt complains of left-sided chest pain onset around 10 PM.  Patient reports that it lasted for several hours.  Seems to be worse when he moves his left arm.  Patient reports it started while he was at work.  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  Reports remote injury to the left shoulder.  Patient reports that onset of chest pain it radiated into his left shoulder, down his left arm and up into his left jaw.  Now reports it is more of an aching feeling continues to hurt in his shoulder and chest.  Denies diaphoresis, shortness of breath, syncope, near syncope, nausea, vomiting.  NSTEMI on 07/05/21.   Review of Systems  Positive: Chest pain, left shoulder pain Negative: Nausea, vomiting, syncope  Physical Exam  BP (!) 145/78 (BP Location: Right Arm)   Pulse 78   Temp 97.9 F (36.6 C) (Oral)   Resp 17   SpO2 98%   Gen:   Awake, no distress   Resp:  Normal effort, clear and equal breath sounds MSK:   Moves extremities without difficulty, no peripheral edema Other:  Speech is clear  Medical Decision Making  Medically screening exam initiated at 12:39 AM.  Appropriate orders placed.  Christopher [name] was informed that the remainder of the evaluation will be completed by another provider, this initial triage assessment does not replace that evaluation, and the importance of remaining in the ED until their evaluation is complete.  Chest pain work-up started   Milta Deiters 11/07/21 0047    Eudelia Bunch Amadeo Garnet, MD 11/07/21 [phone]

## 2021-11-07 NOTE — Discharge Instructions (Addendum)
I have attached 3 offices to these papers.  The first is an office for you to follow-up with about the nodule seen on your scan.  The second is a place for the degenerative changes in your shoulder, use this as needed.  Lastly there is a referral to primary care for you to try and get established.  Continue to try and quit smoking and return to the emergency department with any worsening of your symptoms.

## 2021-11-07 NOTE — ED Triage Notes (Signed)
BIB EMS  Pt from work at [employer].  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  He is pain free except with ROM at this time.

## 2021-11-24 ENCOUNTER — Institutional Professional Consult (permissible substitution): Payer: 59 | Admitting: Pulmonary Disease

## 2021-12-07 ENCOUNTER — Encounter (HOSPITAL_COMMUNITY): Payer: Self-pay | Admitting: Radiology

## 2021-12-10 ENCOUNTER — Institutional Professional Consult (permissible substitution): Payer: 59 | Admitting: Pulmonary Disease

## 2021-12-10 NOTE — Progress Notes (Deleted)
Synopsis: Referred in January 2023 for chest x-ray, lung nodule 1.1 cm left lung base by No ref. provider found  Subjective:   PATIENT ID: Christopher [name] GENDER: male DOB: 04/13/1966, MRN: [id number]  No chief complaint on file.   This is a 56 year old gentleman, past medical history of NSTEMI, smoker, cocaine abuse, coronary artery disease, dyslipidemia.  Patient is a current every day smoker approximately 1 pack/day.Patient had a chest x-ray completed in the emergency department on 11/07/2021 this revealed a 1.1 cm nodule in the left lung base recommending CT scan evaluation.   ***  Past Medical History:  Diagnosis Date   CAD (coronary artery disease)    remote PCI   Cocaine abuse (HCC)    Dyslipidemia    NSTEMI (non-ST elevated myocardial infarction) (HCC) 11/19/2017   in setting of cocaine use   Renal disorder    kidney stones   Smoker      Family History  Problem Relation Age of Onset   Coronary artery disease Father 72       CABG     Past Surgical History:  Procedure Laterality Date   CORONARY ANGIOPLASTY WITH STENT PLACEMENT      Social History   Socioeconomic History   Marital status: Married    Spouse name: Not on file   Number of children: Not on file   Years of education: Not on file   Highest education level: Not on file  Occupational History   Not on file  Tobacco Use   Smoking status: Every Day    Packs/day: 1.00    Types: Cigarettes   Smokeless tobacco: Never  Substance and Sexual Activity   Alcohol use: No   Drug use: Yes    Frequency: 1.0 times per week    Types: Cocaine    Comment: past use   Sexual activity: Not on file  Other Topics Concern   Not on file  Social History Narrative   Not on file   Social Determinants of Health   Financial Resource Strain: Not on file  Food Insecurity: Not on file  Transportation Needs: Not on file  Physical Activity: Not on file  Stress: Not on file  Social Connections: Not on file  Intimate  Partner Violence: Not on file     Allergies  Allergen Reactions   Lipitor [Atorvastatin Calcium] Rash    Pt says he had a rash on his arms with Lipitor and stopped it     Outpatient Medications Prior to Visit  Medication Sig Dispense Refill   acetaminophen (TYLENOL) 325 MG tablet Take 2 tablets (650 mg total) by mouth every 4 (four) hours as needed for headache or mild pain.     aspirin 81 MG EC tablet Take 1 tablet (81 mg total) by mouth daily. Swallow whole. 30 tablet 0   diclofenac Sodium (VOLTAREN) 1 % GEL Apply 2 g topically 4 (four) times daily. 300 g 0   Multiple Vitamin (MULTIVITAMIN) tablet Take 1 tablet by mouth daily.     nitroGLYCERIN (NITROSTAT) 0.4 MG SL tablet Place 1 tablet (0.4 mg total) under the tongue every 5 (five) minutes x 3 doses as needed for chest pain. 25 tablet 0   rosuvastatin (CRESTOR) 10 MG tablet Take 1 tablet (10 mg total) by mouth daily with supper. 30 tablet 0   trimethoprim-polymyxin b (POLYTRIM) ophthalmic solution Place 1 drop into the left eye every 4 (four) hours. 10 mL 0   No facility-administered medications prior to visit.  ROS   Objective:  Physical Exam   There were no vitals filed for this visit.   on *** LPM *** RA BMI Readings from Last 3 Encounters:  07/05/21 36.92 kg/m  04/02/20 37.80 kg/m  04/29/19 36.18 kg/m   Wt Readings from Last 3 Encounters:  07/05/21 250 lb (113.4 kg)  04/02/20 256 lb (116.1 kg)  04/29/19 245 lb (111.1 kg)     CBC    Component Value Date/Time   WBC 8.4 11/07/2021 0051   RBC 5.48 11/07/2021 0051   HGB 17.3 (H) 11/07/2021 0051   HCT 50.1 11/07/2021 0051   PLT 260 11/07/2021 0051   MCV 91.4 11/07/2021 0051   MCH 31.6 11/07/2021 0051   MCHC 34.5 11/07/2021 0051   RDW 12.7 11/07/2021 0051   LYMPHSABS 2.5 11/07/2021 0051   MONOABS 0.9 11/07/2021 0051   EOSABS 0.2 11/07/2021 0051   BASOSABS 0.1 11/07/2021 0051    ***  Chest Imaging: ***  Pulmonary Functions Testing Results: No  flowsheet data found.  FeNO: ***  Pathology: ***  Echocardiogram: ***  Heart Catheterization: ***    Assessment & Plan:   No diagnosis found.  Discussion: ***   Current Outpatient Medications:    acetaminophen (TYLENOL) 325 MG tablet, Take 2 tablets (650 mg total) by mouth every 4 (four) hours as needed for headache or mild pain., Disp: , Rfl:    aspirin 81 MG EC tablet, Take 1 tablet (81 mg total) by mouth daily. Swallow whole., Disp: 30 tablet, Rfl: 0   diclofenac Sodium (VOLTAREN) 1 % GEL, Apply 2 g topically 4 (four) times daily., Disp: 300 g, Rfl: 0   Multiple Vitamin (MULTIVITAMIN) tablet, Take 1 tablet by mouth daily., Disp: , Rfl:    nitroGLYCERIN (NITROSTAT) 0.4 MG SL tablet, Place 1 tablet (0.4 mg total) under the tongue every 5 (five) minutes x 3 doses as needed for chest pain., Disp: 25 tablet, Rfl: 0   rosuvastatin (CRESTOR) 10 MG tablet, Take 1 tablet (10 mg total) by mouth daily with supper., Disp: 30 tablet, Rfl: 0   trimethoprim-polymyxin b (POLYTRIM) ophthalmic solution, Place 1 drop into the left eye every 4 (four) hours., Disp: 10 mL, Rfl: 0  I spent *** minutes dedicated to the care of this patient on the date of this encounter to include pre-visit review of records, face-to-face time with the patient discussing conditions above, post visit ordering of testing, clinical documentation with the electronic health record, making appropriate referrals as documented, and communicating necessary findings to members of the patients care team.   Josephine Igo, DO [facility] 12/10/2021 8:52 AM

## 2022-02-26 IMAGING — DX DG CHEST 2V
2 series · 2 of 2 positions shown · non-contrast
Comparison: 07/05/2021.

CLINICAL DATA: Chest pain.  Pain in left arm.

EXAM:
CHEST - 2 VIEW; LEFT SHOULDER - 2+ VIEW

[chest pa]
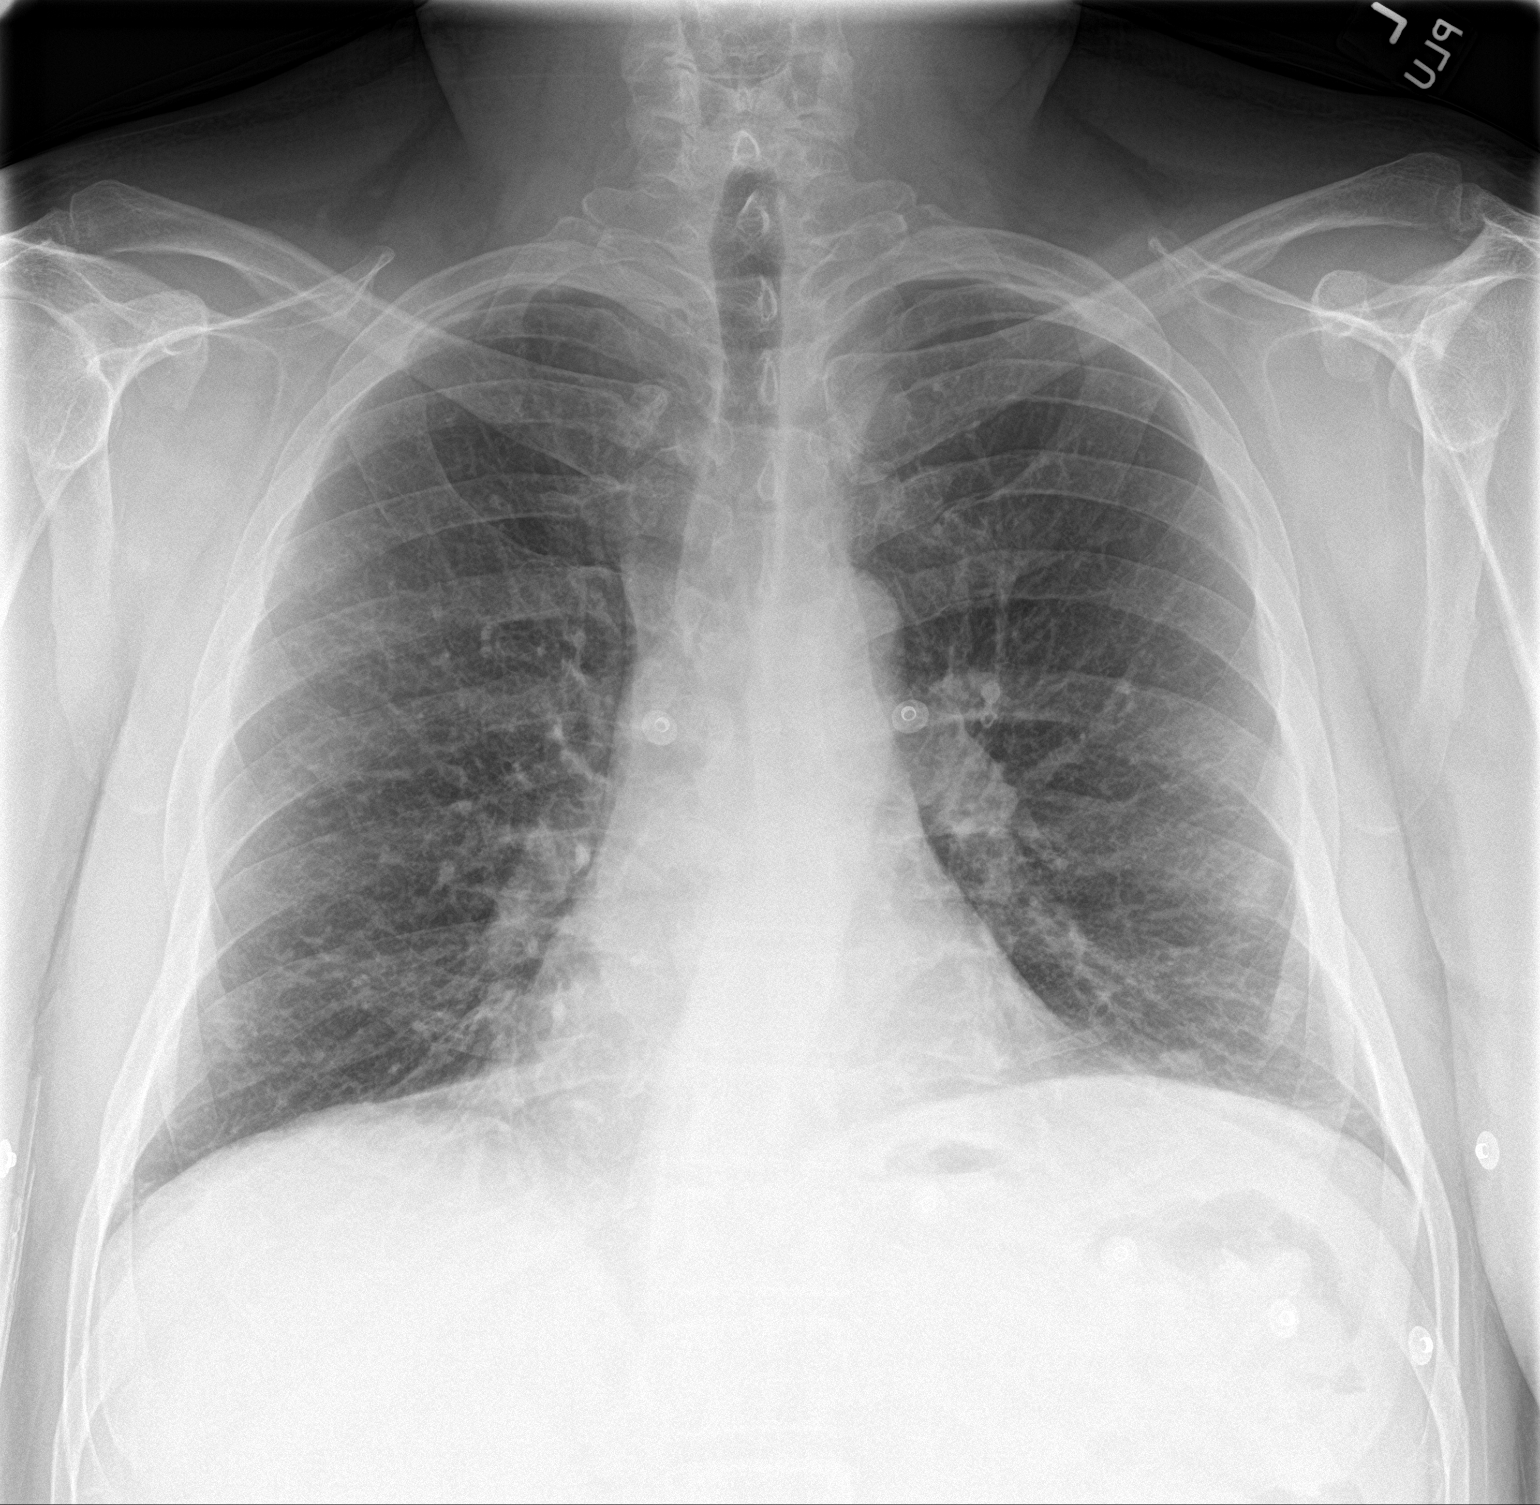

[chest lat]
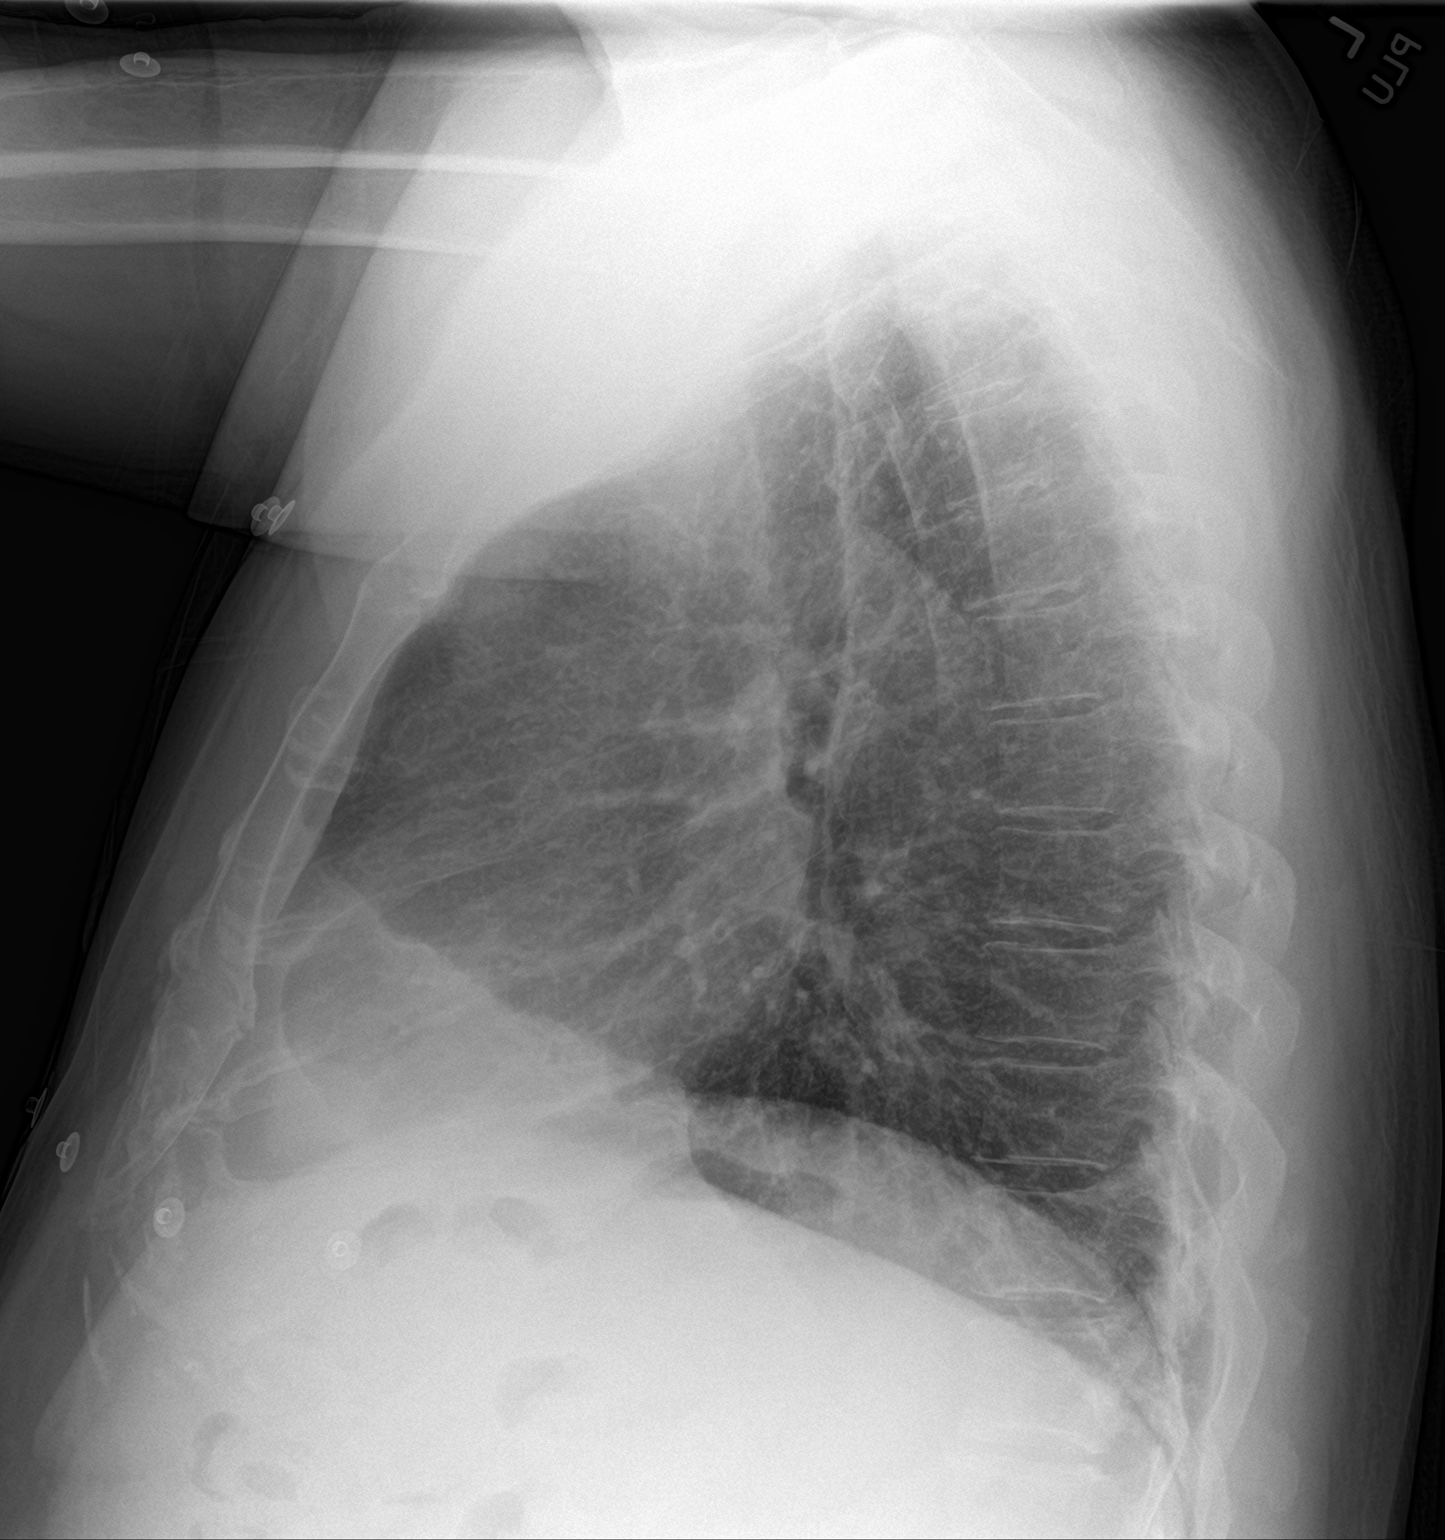

[2 of 2 positions shown; findings below may reference images not displayed]

FINDINGS: The cardiomediastinal silhouette is within normal limits. No
consolidation, effusion, or pneumothorax. A 1.1 cm nodule is present
at the left lung base.

No acute fracture or dislocation is present at the left shoulder.
Mild degenerative changes are noted at the acromioclavicular and
glenohumeral joints.
IMPRESSION: 1. No acute cardiopulmonary process.
2. 1.1 cm nodule at the left lung base. CT is suggested for further
evaluation on follow-up.
3. Mild degenerative changes at the acromioclavicular and
glenohumeral joints on the left.

## 2022-07-04 DIAGNOSIS — Z6835 Body mass index (BMI) 35.0-35.9, adult: Secondary | ICD-10-CM | POA: Diagnosis not present

## 2022-07-04 DIAGNOSIS — L03019 Cellulitis of unspecified finger: Secondary | ICD-10-CM | POA: Diagnosis not present

## 2022-07-25 ENCOUNTER — Emergency Department (HOSPITAL_COMMUNITY)
Admission: EM | Admit: 2022-07-25 | Discharge: 2022-07-25 | Disposition: A | Discharge status: Home / Self Care | Payer: 59 | Attending: Emergency Medicine | Admitting: Emergency Medicine

## 2022-07-25 ENCOUNTER — Encounter (HOSPITAL_COMMUNITY): Payer: Self-pay

## 2022-07-25 ENCOUNTER — Other Ambulatory Visit: Payer: Self-pay

## 2022-07-25 ENCOUNTER — Emergency Department (HOSPITAL_COMMUNITY): Payer: 59

## 2022-07-25 DIAGNOSIS — R1031 Right lower quadrant pain: Secondary | ICD-10-CM | POA: Diagnosis not present

## 2022-07-25 DIAGNOSIS — R1084 Generalized abdominal pain: Secondary | ICD-10-CM | POA: Diagnosis not present

## 2022-07-25 DIAGNOSIS — Z7982 Long term (current) use of aspirin: Secondary | ICD-10-CM | POA: Insufficient documentation

## 2022-07-25 DIAGNOSIS — R197 Diarrhea, unspecified: Secondary | ICD-10-CM | POA: Insufficient documentation

## 2022-07-25 DIAGNOSIS — K4020 Bilateral inguinal hernia, without obstruction or gangrene, not specified as recurrent: Secondary | ICD-10-CM

## 2022-07-25 DIAGNOSIS — K644 Residual hemorrhoidal skin tags: Secondary | ICD-10-CM | POA: Diagnosis not present

## 2022-07-25 DIAGNOSIS — I2510 Atherosclerotic heart disease of native coronary artery without angina pectoris: Secondary | ICD-10-CM | POA: Insufficient documentation

## 2022-07-25 DIAGNOSIS — K429 Umbilical hernia without obstruction or gangrene: Secondary | ICD-10-CM | POA: Diagnosis not present

## 2022-07-25 DIAGNOSIS — K5900 Constipation, unspecified: Secondary | ICD-10-CM | POA: Insufficient documentation

## 2022-07-25 DIAGNOSIS — K760 Fatty (change of) liver, not elsewhere classified: Secondary | ICD-10-CM | POA: Diagnosis not present

## 2022-07-25 DIAGNOSIS — K625 Hemorrhage of anus and rectum: Secondary | ICD-10-CM

## 2022-07-25 DIAGNOSIS — R140 Abdominal distension (gaseous): Secondary | ICD-10-CM | POA: Diagnosis not present

## 2022-07-25 DIAGNOSIS — K4090 Unilateral inguinal hernia, without obstruction or gangrene, not specified as recurrent: Secondary | ICD-10-CM | POA: Diagnosis not present

## 2022-07-25 LAB — CBC WITH DIFFERENTIAL/PLATELET
Abs Immature Granulocytes: 0.04 10*3/uL (ref 0.00–0.07)
Basophils Absolute: 0.1 10*3/uL (ref 0.0–0.1)
Basophils Relative: 1 %
Eosinophils Absolute: 0.2 10*3/uL (ref 0.0–0.5)
Eosinophils Relative: 2 %
HCT: 50.2 % (ref 39.0–52.0)
Hemoglobin: 17.2 g/dL — ABNORMAL HIGH (ref 13.0–17.0)
Immature Granulocytes: 1 %
Lymphocytes Relative: 31 %
Lymphs Abs: 2.4 10*3/uL (ref 0.7–4.0)
MCH: 30.9 pg (ref 26.0–34.0)
MCHC: 34.3 g/dL (ref 30.0–36.0)
MCV: 90.3 fL (ref 80.0–100.0)
Monocytes Absolute: 0.9 10*3/uL (ref 0.1–1.0)
Monocytes Relative: 11 %
Neutro Abs: 4.3 10*3/uL (ref 1.7–7.7)
Neutrophils Relative %: 54 %
Platelets: 235 10*3/uL (ref 150–400)
RBC: 5.56 MIL/uL (ref 4.22–5.81)
RDW: 13 % (ref 11.5–15.5)
WBC: 7.9 10*3/uL (ref 4.0–10.5)
nRBC: 0 % (ref 0.0–0.2)

## 2022-07-25 LAB — COMPREHENSIVE METABOLIC PANEL
ALT: 46 U/L — ABNORMAL HIGH (ref 0–44)
AST: 26 U/L (ref 15–41)
Albumin: 4.3 g/dL (ref 3.5–5.0)
Alkaline Phosphatase: 54 U/L (ref 38–126)
Anion gap: 9 (ref 5–15)
BUN: 13 mg/dL (ref 6–20)
CO2: 26 mmol/L (ref 22–32)
Calcium: 9.4 mg/dL (ref 8.9–10.3)
Chloride: 104 mmol/L (ref 98–111)
Creatinine, Ser: 0.86 mg/dL (ref 0.61–1.24)
GFR, Estimated: >60 mL/min (ref >60)
Glucose, Bld: 94 mg/dL (ref 70–99)
Potassium: 4.4 mmol/L (ref 3.5–5.1)
Sodium: 139 mmol/L (ref 135–145)
Total Bilirubin: 0.6 mg/dL (ref 0.3–1.2)
Total Protein: 7.6 g/dL (ref 6.5–8.1)

## 2022-07-25 LAB — LIPASE, BLOOD: Lipase: 51 U/L (ref 11–51)

## 2022-07-25 MED ORDER — IOHEXOL 300 MG/ML  SOLN
100.0000 mL | Freq: Once | INTRAMUSCULAR | Status: AC | PRN
  Administered 2022-07-25: 100 mL via INTRAVENOUS

## 2022-07-25 MED ORDER — OXYCODONE-ACETAMINOPHEN 5-325 MG PO TABS
1.0000 | ORAL_TABLET | Freq: Once | ORAL | Status: AC
Start: 2022-07-25 — End: 2022-07-25
  Administered 2022-07-25: 1 via ORAL
  Filled 2022-07-25: qty 1

## 2022-07-25 MED ORDER — OXYCODONE-ACETAMINOPHEN 5-325 MG PO TABS: 1.0000 | ORAL_TABLET | Freq: Four times a day (QID) | ORAL | 0 refills | Status: DC | PRN

## 2022-07-25 NOTE — Discharge Instructions (Addendum)
Take the medication as prescribed, but use sparingly as this will make you drowsy, do not drive within 4 hours of taking this pain medication.  Be aware that it can also cause constipation.  It may actually help with your current diarrhea, if you continue to have diarrhea however I do recommend trying Imodium which generally will relieve the symptom.

## 2022-07-25 NOTE — ED Provider Notes (Signed)
Patient signed out to me at shift change from [facility], PA-C, patient with diffuse abdominal pain, intermittent rectal bleeding, historically has had problems with constipation but endorses that this week he has actually had looser than normal stools 3-4 episodes per day.  Also having intermittent bright red blood per rectum, pending CT imaging.  His CT today is positive for bilateral inguinal hernias, also umbilical hernia, right inguinal largest, all with fat-containing, no intestinal obstruction identified.  This findings were discussed with the patient, he states he knew about the right inguinal and the umbilical hernia was unaware of the left hernia.  He has no evidence for abdominal mass, obstruction.  He does have a hepatic steatosis which was discussed with him.  He was encouraged that he needs to follow-up with gastroenterology for screening colonoscopy and other test is indicated secondary to the rectal bleeding.  He denies history of PUD, although does endorse having 1 episode of reflux a week ago but none since.  We also discussed consideration of general surgery consultation given his multiple hernias.  Discussed return precautions regarding these hernias and the rectal bleeding as well.  He was prescribed oxycodone for sparing use of his abdominal pain.  Discussed that this may also cause constipation, so he will hold Imodium unless needed in addition.  He was given referrals to Dr. Marletta Lor and Dr. Henreitta Leber.  Results for orders placed or performed during the hospital encounter of 07/25/22  CBC with Differential  Result Value Ref Range   WBC 7.9 4.0 - 10.5 K/uL   RBC 5.56 4.22 - 5.81 MIL/uL   Hemoglobin 17.2 (H) 13.0 - 17.0 g/dL   HCT 35.0 09.3 - 81.8 %   MCV 90.3 80.0 - 100.0 fL   MCH 30.9 26.0 - 34.0 pg   MCHC 34.3 30.0 - 36.0 g/dL   RDW 29.9 37.1 - 69.6 %   Platelets 235 150 - 400 K/uL   nRBC 0.0 0.0 - 0.2 %   Neutrophils Relative % 54 %   Neutro Abs 4.3 1.7 - 7.7 K/uL    Lymphocytes Relative 31 %   Lymphs Abs 2.4 0.7 - 4.0 K/uL   Monocytes Relative 11 %   Monocytes Absolute 0.9 0.1 - 1.0 K/uL   Eosinophils Relative 2 %   Eosinophils Absolute 0.2 0.0 - 0.5 K/uL   Basophils Relative 1 %   Basophils Absolute 0.1 0.0 - 0.1 K/uL   Immature Granulocytes 1 %   Abs Immature Granulocytes 0.04 0.00 - 0.07 K/uL  Comprehensive metabolic panel  Result Value Ref Range   Sodium 139 135 - 145 mmol/L   Potassium 4.4 3.5 - 5.1 mmol/L   Chloride 104 98 - 111 mmol/L   CO2 26 22 - 32 mmol/L   Glucose, Bld 94 70 - 99 mg/dL   BUN 13 6 - 20 mg/dL   Creatinine, Ser 7.89 0.61 - 1.24 mg/dL   Calcium 9.4 8.9 - 38.1 mg/dL   Total Protein 7.6 6.5 - 8.1 g/dL   Albumin 4.3 3.5 - 5.0 g/dL   AST 26 15 - 41 U/L   ALT 46 (H) 0 - 44 U/L   Alkaline Phosphatase 54 38 - 126 U/L   Total Bilirubin 0.6 0.3 - 1.2 mg/dL   GFR, Estimated >01 >75 mL/min   Anion gap 9 5 - 15  Lipase, blood  Result Value Ref Range   Lipase 51 11 - 51 U/L   CT ABDOMEN PELVIS W CONTRAST  Result Date: 07/25/2022  CLINICAL DATA:  Abdominal pain, RIGHT lower quadrant pain for 8 months, intermittent rectal bleeding and frequent diarrhea. EXAM: CT ABDOMEN AND PELVIS WITH CONTRAST TECHNIQUE: Multidetector CT imaging of the abdomen and pelvis was performed using the standard protocol following bolus administration of intravenous contrast. RADIATION DOSE REDUCTION: This exam was performed according to the departmental dose-optimization program which includes automated exposure control, adjustment of the mA and/or kV according to patient size and/or use of iterative reconstruction technique. CONTRAST:  OMNIPAQUE IOHEXOL 300 MG/ML  SOLN COMPARISON:  Mar 28, 2020 FINDINGS: Lower chest: Mild basilar atelectasis. No effusion. No consolidative changes. Hepatobiliary: Moderate to marked hepatic steatosis. Fissural widening of hepatic fissures. No signs of portal hypertension. No focal, suspicious hepatic lesion. The portal  vein is patent. No pericholecystic stranding or sign of biliary duct distension. Pancreas: Normal, without mass, inflammation or ductal dilatation. Spleen: Normal. Adrenals/Urinary Tract: Adrenal glands are normal. Symmetric renal enhancement without hydronephrosis or perinephric stranding. No perivesical stranding. No suspicious renal lesion. Stomach/Bowel: No acute gastric abnormality. No small bowel dilation or signs of small bowel inflammation. Appendix is normal. No colonic dilation or inflammation. Vascular/Lymphatic: No signs of adenopathy in the retroperitoneum or in the upper abdomen. Mesenteric stranding seen previously and mildly prominent subcentimeter lymph nodes that were seen previously in the root of the small bowel mesentery have nearly completely resolved. The largest lymph node in the small bowel mesentery is now approximately 3 mm short axis as compared to 8 mm on the prior study. Signs of aortic atherosclerosis post calcified and noncalcified. No aortic dilation. No pelvic sidewall lymphadenopathy. Reproductive: Unremarkable by CT. Other: Moderate RIGHT and small LEFT inguinal hernias containing fat. Small fat containing umbilical hernia. No ascites Musculoskeletal: No acute bone finding. No destructive bone process. Spinal degenerative changes. Degenerative changes are mild. IMPRESSION: 1. No acute findings in the abdomen or pelvis. 2. Moderate hepatic moderate to marked hepatic steatosis with fissural widening. Correlate with signs of liver disease. 3. Moderate RIGHT and small LEFT inguinal hernias containing fat. 4. Small fat containing umbilical hernia. Aortic Atherosclerosis (ICD10-I70.0). Electronically Signed   By: Donzetta Kohut M.D.   On: 07/25/2022 21:57       Victoriano Lain 07/26/22 Vanessa Kick, MD 07/28/22 [phone]

## 2022-07-25 NOTE — ED Provider Notes (Signed)
[facility] EMERGENCY DEPARTMENT Provider Note   CSN: [id number] Arrival date & time: 07/25/22  1641     History Chief Complaint  Patient presents with   Abdominal Pain   Rectal Bleeding    Christopher [name] is a 56 y.o. male with history of coronary artery disease, cocaine abuse, dyslipidemia who presents to the emergency department today with chronic worsening abdominal pain and change in bowel habits.  This is been ongoing and worsening over the last few months. He describes mucous dark stools. He reports associated diffuse abdominal distension, early satiety and eats about one meal a day, night sweats, and alternating diarrhea and constipation.  He denies any weight loss, fever, chills, nausea, vomiting. He has never received a colonoscopy.    Abdominal Pain Associated symptoms: hematochezia   Rectal Bleeding Associated symptoms: abdominal pain        Home Medications Prior to Admission medications   Medication Sig Start Date End Date Taking? Authorizing Provider  acetaminophen (TYLENOL) 325 MG tablet Take 2 tablets (650 mg total) by mouth every 4 (four) hours as needed for headache or mild pain. 11/20/17   Abelino Derrick, PA-C  aspirin 81 MG EC tablet Take 1 tablet (81 mg total) by mouth daily. Swallow whole. 07/07/21   Evlyn Kanner, MD  diclofenac Sodium (VOLTAREN) 1 % GEL Apply 2 g topically 4 (four) times daily. 11/07/21   Redwine, Madison A, PA-C  Multiple Vitamin (MULTIVITAMIN) tablet Take 1 tablet by mouth daily.    [provider]  nitroGLYCERIN (NITROSTAT) 0.4 MG SL tablet Place 1 tablet (0.4 mg total) under the tongue every 5 (five) minutes x 3 doses as needed for chest pain. 11/07/21   Redwine, Madison A, PA-C  rosuvastatin (CRESTOR) 10 MG tablet Take 1 tablet (10 mg total) by mouth daily with supper. 07/06/21   Evlyn Kanner, MD  trimethoprim-polymyxin b (POLYTRIM) ophthalmic solution Place 1 drop into the left eye every 4 (four) hours. 08/20/21   Wurst,  [place], PA-C      Allergies    Hydrocodone and Lipitor [atorvastatin calcium]    Review of Systems   Review of Systems  Gastrointestinal:  Positive for abdominal pain and hematochezia.  All other systems reviewed and are negative.   Physical Exam Updated Vital Signs BP (!) 136/95   Pulse 79   Temp 98 F (36.7 C) (Oral)   Resp 18   SpO2 95%  Physical Exam Vitals and nursing note reviewed. Exam conducted with a chaperone present.  Constitutional:      General: He is not in acute distress.    Appearance: Normal appearance.  HENT:     Head: Normocephalic and atraumatic.  Eyes:     General:        Right eye: No discharge.        Left eye: No discharge.  Cardiovascular:     Comments: Regular rate and rhythm.  S1/S2 are distinct without any evidence of murmur, rubs, or gallops.  Radial pulses are 2+ bilaterally.  Dorsalis pedis pulses are 2+ bilaterally.  No evidence of pedal edema. Pulmonary:     Comments: Clear to auscultation bilaterally.  Normal effort.  No respiratory distress.  No evidence of wheezes, rales, or rhonchi heard throughout. Abdominal:     General: Abdomen is flat. Bowel sounds are normal. There is distension.     Tenderness: There is generalized abdominal tenderness. There is no guarding or rebound.    Genitourinary:    Rectum: Guaiac result negative.  Comments: Digital rectal exam did not reveal any significant masses or anal fissures.  Patient had good rectal tone.  No stool present in the rectal vault.  Small nonthrombosed external hemorrhoids. Musculoskeletal:        General: Normal range of motion.     Cervical back: Neck supple.  Skin:    General: Skin is warm and dry.     Findings: No rash.  Neurological:     General: No focal deficit present.     Mental Status: He is alert.  Psychiatric:        Mood and Affect: Mood normal.        Behavior: Behavior normal.     ED Results / Procedures / Treatments   Labs (all labs ordered are  listed, but only abnormal results are displayed) Labs Reviewed  CBC WITH DIFFERENTIAL/PLATELET  COMPREHENSIVE METABOLIC PANEL  LIPASE, BLOOD  POC OCCULT BLOOD, ED    EKG None  Radiology No results found.  Procedures Procedures    Medications Ordered in ED Medications - No data to display  ED Course/ Medical Decision Making/ A&P                           Medical Decision Making Christopher [name] is a 56 y.o. male patient who presents to the emergency department with chronic abdominal pain, distention, and alternating bowel habits.  Patient has not had a colonoscopy.  Given the history physical exam, I am somewhat concerned for underlying malignancy.  We will get labs and imaging to further evaluate.  Overall, patient is in no acute distress at this time.  However, we will plan to reassess once some of the work-up results.  Most of the patient's work-up is still pending.  There is a change, the patient's care will be transferred to [facility] PA-C where ultimate disposition will be made.   Amount and/or Complexity of Data Reviewed Labs: ordered. Radiology: ordered.   Final Clinical Impression(s) / ED Diagnoses Final diagnoses:  None    Rx / DC Orders ED Discharge Orders     None         Jolyn Lent 07/25/22 1914    Jacalyn Lefevre, MD 07/25/22 2259

## 2022-07-25 NOTE — ED Triage Notes (Addendum)
Complaining of RLQ pain for the past 8 months with intermittent rectal bleeding. States that stools are dark in color at times, blood bright red. Complaining of frequent diarrhea and issues with constipation. States that he has not seen a provider about issues due to no medical insurance.

## 2022-07-26 ENCOUNTER — Telehealth (HOSPITAL_COMMUNITY): Payer: Self-pay | Admitting: Emergency Medicine

## 2022-07-26 MED ORDER — OXYCODONE-ACETAMINOPHEN 5-325 MG PO TABS: 1.0000 | ORAL_TABLET | Freq: Four times a day (QID) | ORAL | 0 refills | Status: DC | PRN

## 2022-07-26 NOTE — Telephone Encounter (Signed)
I was called by the care coordinator here at the hospital I stated the patient was unable to pick up his prescriptions at the current pharmacy it was sent to does not take his insurance.  He requests it sent to another pharmacy.

## 2022-07-28 ENCOUNTER — Encounter: Payer: Self-pay | Admitting: *Deleted

## 2022-07-28 ENCOUNTER — Ambulatory Visit (INDEPENDENT_AMBULATORY_CARE_PROVIDER_SITE_OTHER): Payer: 59 | Admitting: Internal Medicine

## 2022-07-28 ENCOUNTER — Encounter: Payer: Self-pay | Admitting: Internal Medicine

## 2022-07-28 VITALS — BP 134/72 | HR 87 | Temp 97.7°F | Ht 69.0 in | Wt 253.8 lb

## 2022-07-28 DIAGNOSIS — K760 Fatty (change of) liver, not elsewhere classified: Secondary | ICD-10-CM | POA: Diagnosis not present

## 2022-07-28 DIAGNOSIS — Z1211 Encounter for screening for malignant neoplasm of colon: Secondary | ICD-10-CM

## 2022-07-28 DIAGNOSIS — K219 Gastro-esophageal reflux disease without esophagitis: Secondary | ICD-10-CM

## 2022-07-28 DIAGNOSIS — K921 Melena: Secondary | ICD-10-CM | POA: Diagnosis not present

## 2022-07-28 MED ORDER — PEG 3350-KCL-NA BICARB-NACL 420 G PO SOLR
4000.0000 mL | Freq: Once | ORAL | 0 refills | Status: AC
Start: 2022-07-28 — End: 2022-07-28

## 2022-07-28 NOTE — Progress Notes (Signed)
Primary Care Physician:  Pcp, No Primary Gastroenterologist:  Dr. Marletta Lor  Chief complaint: GERD, melena, colon cancer screening   HPI:   Christopher [name] is a 56 y.o. male who presents to clinic today as a new patient.  Wishes to schedule colonoscopy for colon cancer screening purposes.  No previous colonoscopy.  No family history of colorectal malignancy.  Notes new onset GERD x1 to 2 weeks.  Wakes up in the middle of the night with acid sensation and burning in his throat.  Symptoms are intermittent, mild to moderate in severity.  Has also noted black stools as of late.  Denies any epigastric or chest pain.  No chronic NSAID use.  No history of peptic ulcer disease that she is aware of.  No history of H. pylori.  Recent hospital ER visit 07/25/2022 due to worsening symptoms.  CT abdomen pelvis with contrast showed marked hepatic steatosis, normal pancreas, normal bowels.  Blood work unremarkable.  Past Medical History:  Diagnosis Date   CAD (coronary artery disease)    remote PCI   Cocaine abuse (HCC)    Dyslipidemia    NSTEMI (non-ST elevated myocardial infarction) (HCC) 11/19/2017   in setting of cocaine use   Renal disorder    kidney stones   Smoker     Past Surgical History:  Procedure Laterality Date   CORONARY ANGIOPLASTY WITH STENT PLACEMENT      Current Outpatient Medications  Medication Sig Dispense Refill   acetaminophen (TYLENOL) 325 MG tablet Take 2 tablets (650 mg total) by mouth every 4 (four) hours as needed for headache or mild pain.     aspirin 81 MG EC tablet Take 1 tablet (81 mg total) by mouth daily. Swallow whole. 30 tablet 0   diclofenac Sodium (VOLTAREN) 1 % GEL Apply 2 g topically 4 (four) times daily. 300 g 0   Multiple Vitamin (MULTIVITAMIN) tablet Take 1 tablet by mouth daily.     nitroGLYCERIN (NITROSTAT) 0.4 MG SL tablet Place 1 tablet (0.4 mg total) under the tongue every 5 (five) minutes x 3 doses as needed for chest pain. 25 tablet 0    oxyCODONE-acetaminophen (PERCOCET/ROXICET) 5-325 MG tablet Take 1 tablet by mouth every 6 (six) hours as needed for severe pain. (Patient not taking: Reported on 07/28/2022) 15 tablet 0   rosuvastatin (CRESTOR) 10 MG tablet Take 1 tablet (10 mg total) by mouth daily with supper. (Patient not taking: Reported on 07/28/2022) 30 tablet 0   trimethoprim-polymyxin b (POLYTRIM) ophthalmic solution Place 1 drop into the left eye every 4 (four) hours. (Patient not taking: Reported on 07/28/2022) 10 mL 0   No current facility-administered medications for this visit.    Allergies as of 07/28/2022 - Review Complete 07/28/2022  Allergen Reaction Noted   Hydrocodone Itching 07/25/2022   Naproxen Itching 10/28/2011   Atorvastatin calcium Rash 12/15/2017   Lipitor [atorvastatin calcium] Rash 12/15/2017    Family History  Problem Relation Age of Onset   Coronary artery disease Father 68       CABG    Social History   Socioeconomic History   Marital status: Married    Spouse name: Not on file   Number of children: Not on file   Years of education: Not on file   Highest education level: Not on file  Occupational History   Not on file  Tobacco Use   Smoking status: Every Day    Packs/day: 1.00    Types: Cigarettes   Smokeless  tobacco: Never  Substance and Sexual Activity   Alcohol use: No   Drug use: Not Currently    Frequency: 1.0 times per week    Types: Cocaine    Comment: past use   Sexual activity: Yes  Other Topics Concern   Not on file  Social History Narrative   Not on file   Social Determinants of Health   Financial Resource Strain: Not on file  Food Insecurity: Not on file  Transportation Needs: Not on file  Physical Activity: Not on file  Stress: Not on file  Social Connections: Not on file  Intimate Partner Violence: Not on file    Subjective: Review of Systems  Constitutional:  Negative for chills and fever.  HENT:  Negative for congestion and hearing loss.    Eyes:  Negative for blurred vision and double vision.  Respiratory:  Negative for cough and shortness of breath.   Cardiovascular:  Negative for chest pain and palpitations.  Gastrointestinal:  Positive for blood in stool and constipation. Negative for abdominal pain, diarrhea, heartburn, melena and vomiting.       Melena  Genitourinary:  Negative for dysuria and urgency.  Musculoskeletal:  Negative for joint pain and myalgias.  Skin:  Negative for itching and rash.  Neurological:  Negative for dizziness and headaches.  Psychiatric/Behavioral:  Negative for depression. The patient is not nervous/anxious.        Objective: BP 134/72 (BP Location: Left Arm, Patient Position: Sitting, Cuff Size: Large)   Pulse 87   Temp 97.7 F (36.5 C) (Temporal)   Ht 5\' 9"  (1.753 m)   Wt 253 lb 12.8 oz (115.1 kg)   SpO2 94%   BMI 37.48 kg/m  Physical Exam Constitutional:      Appearance: Normal appearance.  HENT:     Head: Normocephalic and atraumatic.  Eyes:     Extraocular Movements: Extraocular movements intact.     Conjunctiva/sclera: Conjunctivae normal.  Cardiovascular:     Rate and Rhythm: Normal rate and regular rhythm.  Pulmonary:     Effort: Pulmonary effort is normal.     Breath sounds: Normal breath sounds.  Abdominal:     General: Bowel sounds are normal.     Palpations: Abdomen is soft.  Musculoskeletal:        General: Normal range of motion.     Cervical back: Normal range of motion and neck supple.  Skin:    General: Skin is warm.  Neurological:     General: No focal deficit present.     Mental Status: He is alert and oriented to person, place, and time.  Psychiatric:        Mood and Affect: Mood normal.        Behavior: Behavior normal.      Assessment: *GERD-new onset *Melena *Colon cancer screening  *B/L inguinal hernias, umbilical hernia *Fatty liver  Plan: Will schedule for EGD to evaluate for peptic ulcer disease, esophagitis, gastritis, H.  Pylori, duodenitis, or other. Will also evaluate for esophageal stricture, Schatzki's ring, esophageal web or other.   At the same time we will perform colonoscopy for colon cancer screening purposes  The risks including infection, bleed, or perforation as well as benefits, limitations, alternatives and imponderables have been reviewed with the patient. Potential for esophageal dilation, biopsy, etc. have also been reviewed.  Questions have been answered. All parties agreeable.  History of cocaine use, states he has been sober for over 1 year.  Patient to see general surgeon  to discuss multiple hernias next month.  Follow-up after procedures to further discuss fatty liver disease.  Further recommendations to follow.  07/28/2022 3:08 PM   Disclaimer: This note was dictated with voice recognition software. Similar sounding words can inadvertently be transcribed and may not be corrected upon review.

## 2022-07-28 NOTE — Patient Instructions (Signed)
We will schedule you for upper endoscopy and colonoscopy to further evaluate your GI symptoms.  Further recommendations to follow.  It was very nice meeting you today.  Dr. Marletta Lor

## 2022-08-11 ENCOUNTER — Telehealth: Payer: Self-pay

## 2022-08-11 NOTE — Telephone Encounter (Signed)
Spoke with pt. He stated he is not wanting to cancel procedure. He stated he got a call and was told he needed to pay $300 up front. I advised him when he goes for procedure to tell them to bill him. He voiced understanding.

## 2022-08-11 NOTE — Telephone Encounter (Signed)
Pt called regarding procedure cost out of pocket. Pt states he doesn't have a job and we were advised of this.

## 2022-08-16 ENCOUNTER — Telehealth: Payer: Self-pay | Admitting: *Deleted

## 2022-08-16 ENCOUNTER — Encounter (HOSPITAL_COMMUNITY)
Admission: RE | Admit: 2022-08-16 | Discharge: 2022-08-16 | Disposition: A | Discharge status: Home / Self Care | Payer: 59 | Source: Ambulatory Visit | Attending: Internal Medicine | Admitting: Internal Medicine

## 2022-08-16 ENCOUNTER — Encounter (HOSPITAL_COMMUNITY): Payer: Self-pay

## 2022-08-16 DIAGNOSIS — F1410 Cocaine abuse, uncomplicated: Secondary | ICD-10-CM

## 2022-08-16 HISTORY — DX: Personal history of urinary calculi: Z87.442

## 2022-08-16 NOTE — Telephone Encounter (Signed)
-----   Message from Jacqulynn Cadet, RN sent at 08/16/2022  9:46 AM EDT ----- Regarding: Clearance Good Morning! Patient was scheduled for an EGD/TCS on 08/18/2022.  He had an MI  (cocaine related) with stent placement in 2018.  He has not seen cardiology since his heart attack and states that he "ran out of his blood thinner".  Patient does not have a primary provider.  He will need clearance for [place] to proceed.  Ive attempted to contact several medical providers but they are not taking new patents.  Can you help [place] with this

## 2022-08-16 NOTE — Patient Instructions (Signed)
Marciano SequinRobert G Hucker  08/16/2022     @[email or web address] @   Your procedure is scheduled on 08/18/2022.  Report to [facility] at 9:00 A.M.  Call this number if you have problems the morning of surgery:  [phone]   Remember:   Please Follow the Diet and Prep instructions given to you by Dr Queen Blossomarver's office.     Take these medicines the morning of surgery with A SIP OF WATER :  none    Do not wear jewelry, make-up or nail polish.  Do not wear lotions, powders, or perfumes, or deodorant.  Do not shave 48 hours prior to surgery.  Men may shave face and neck.  Do not bring valuables to the hospital.  [facility] is not responsible for any belongings or valuables.  Contacts, dentures or bridgework may not be worn into surgery.  Leave your suitcase in the car.  After surgery it may be brought to your room.  For patients admitted to the hospital, discharge time will be determined by your treatment team.  Patients discharged the day of surgery will not be allowed to drive home.   Name and phone number of your driver:   family Special instructions:  N/A  Please read over the following fact sheets that you were given. Care and Recovery After Surgery  Colonoscopy, Adult A colonoscopy is a procedure to look at the entire large intestine. This procedure is done using a long, thin, flexible tube that has a camera on the end. You may have a colonoscopy: As a part of normal colorectal screening. If you have certain symptoms, such as: A low number of red blood cells in your blood (anemia). Diarrhea that does not go away. Pain in your abdomen. Blood in your stool. A colonoscopy can help screen for and diagnose medical problems, including: An abnormal growth of cells or tissue (tumor). Abnormal growths within the lining of your intestine (polyps). Inflammation. Areas of bleeding. Tell your health care provider about: Any allergies you have. All medicines you are taking, including  vitamins, herbs, eye drops, creams, and over-the-counter medicines. Any problems you or family members have had with anesthetic medicines. Any bleeding problems you have. Any surgeries you have had. Any medical conditions you have. Any problems you have had with having bowel movements. Whether you are pregnant or may be pregnant. What are the risks? Generally, this is a safe procedure. However, problems may occur, including: Bleeding. Damage to your intestine. Allergic reactions to medicines given during the procedure. Infection. This is rare. What happens before the procedure? Eating and drinking restrictions Follow instructions from your health care provider about eating or drinking restrictions, which may include: A few days before the procedure: Follow a low-fiber diet. Avoid nuts, seeds, dried fruit, raw fruits, and vegetables. 1-3 days before the procedure: Eat only gelatin dessert or ice pops. Drink only clear liquids, such as water, clear juice, clear broth or bouillon, black coffee or tea, or clear soft drinks or sports drinks. Avoid liquids that contain red or purple dye. The day of the procedure: Do not eat solid foods. You may continue to drink clear liquids until up to 2 hours before the procedure. Do not eat or drink anything starting 2 hours before the procedure, or within the time period that your health care provider recommends. Bowel prep If you were prescribed a bowel prep to take by mouth (orally) to clean out your colon: Take it as told by your health care provider. Starting the  day before your procedure, you will need to drink a large amount of liquid medicine. The liquid will cause you to have many bowel movements of loose stool until your stool becomes almost clear or light green. If your skin or the opening between the buttocks (anus) gets irritated from diarrhea, you may relieve the irritation using: Wipes with medicine in them, such as adult wet wipes with  aloe and vitamin E. A product to soothe skin, such as petroleum jelly. If you vomit while drinking the bowel prep: Take a break for up to 60 minutes. Begin the bowel prep again. Call your health care provider if you keep vomiting or you cannot take the bowel prep without vomiting. To clean out your colon, you may also be given: Laxative medicines. These help you have a bowel movement. Instructions for enema use. An enema is liquid medicine injected into your rectum. Medicines Ask your health care provider about: Changing or stopping your regular medicines or supplements. This is especially important if you are taking iron supplements, diabetes medicines, or blood thinners. Taking medicines such as aspirin and ibuprofen. These medicines can thin your blood. Do not take these medicines unless your health care provider tells you to take them. Taking over-the-counter medicines, vitamins, herbs, and supplements. General instructions Ask your health care provider what steps will be taken to help prevent infection. These may include washing skin with a germ-killing soap. If you will be going home right after the procedure, plan to have a responsible adult: Take you home from the hospital or clinic. You will not be allowed to drive. Care for you for the time you are told. What happens during the procedure?  An IV will be inserted into one of your veins. You will be given a medicine to make you fall asleep (general anesthetic). You will lie on your side with your knees bent. A lubricant will be put on the tube. Then the tube will be: Inserted into your anus. Gently eased through all parts of your large intestine. Air will be sent into your colon to keep it open. This may cause some pressure or cramping. Images will be taken with the camera and will appear on a screen. A small tissue sample may be removed to be looked at under a microscope (biopsy). The tissue may be sent to a lab for testing if  any signs of problems are found. If small polyps are found, they may be removed and checked for cancer cells. When the procedure is finished, the tube will be removed. The procedure may vary among health care providers and hospitals. What happens after the procedure? Your blood pressure, heart rate, breathing rate, and blood oxygen level will be monitored until you leave the hospital or clinic. You may have a small amount of blood in your stool. You may pass gas and have mild cramping or bloating in your abdomen. This is caused by the air that was used to open your colon during the exam. If you were given a sedative during the procedure, it can affect you for several hours. Do not drive or operate machinery until your health care provider says that it is safe. It is up to you to get the results of your procedure. Ask your health care provider, or the department that is doing the procedure, when your results will be ready. Summary A colonoscopy is a procedure to look at the entire large intestine. Follow instructions from your health care provider about eating and drinking before  the procedure. If you were prescribed an oral bowel prep to clean out your colon, take it as told by your health care provider. During the colonoscopy, a flexible tube with a camera on its end is inserted into the anus and then passed into all parts of the large intestine. This information is not intended to replace advice given to you by your health care provider. Make sure you discuss any questions you have with your health care provider. Document Revised: 11/08/2021 Document Reviewed: 07/07/2021 Elsevier Patient Education  2023 [employer] Upper Endoscopy, Adult Upper endoscopy is a procedure to look inside the upper GI (gastrointestinal) tract. The upper GI tract is made up of: The esophagus. This is the part of the body that moves food from your mouth to your stomach. The stomach. The duodenum. This is the first  part of your small intestine. This procedure is also called esophagogastroduodenoscopy (EGD) or gastroscopy. In this procedure, your health care provider passes a thin, flexible tube (endoscope) through your mouth and down your esophagus into your stomach and into your duodenum. A small camera is attached to the end of the tube. Images from the camera appear on a monitor in the exam room. During this procedure, your health care provider may also remove a small piece of tissue to be sent to a lab and examined under a microscope (biopsy). Your health care provider may do an upper endoscopy to diagnose cancers of the upper GI tract. You may also have this procedure to find the cause of other conditions, such as: Stomach pain. Heartburn. Pain or problems when swallowing. Nausea and vomiting. Stomach bleeding. Stomach ulcers. Tell a health care provider about: Any allergies you have. All medicines you are taking, including vitamins, herbs, eye drops, creams, and over-the-counter medicines. Any problems you or family members have had with anesthetic medicines. Any bleeding problems you have. Any surgeries you have had. Any medical conditions you have. Whether you are pregnant or may be pregnant. What are the risks? Your healthcare provider will talk with you about risks. These may include: Infection. Bleeding. Allergic reactions to medicines. A tear or hole (perforation) in the esophagus, stomach, or duodenum. What happens before the procedure? When to stop eating and drinking Follow instructions from your health care provider about what you may eat and drink. These may include: 8 hours before your procedure Stop eating most foods. Do not eat meat, fried foods, or fatty foods. Eat only light foods, such as toast or crackers. All liquids are okay except energy drinks and alcohol. 6 hours before your procedure Stop eating. Drink only clear liquids, such as water, clear fruit juice, black  coffee, plain tea, and sports drinks. Do not drink energy drinks or alcohol. 2 hours before your procedure Stop drinking all liquids. You may be allowed to take medicines with small sips of water. If you do not follow your health care provider's instructions, your procedure may be delayed or canceled. Medicines Ask your health care provider about: Changing or stopping your regular medicines. This is especially important if you are taking diabetes medicines or blood thinners. Taking medicines such as aspirin and ibuprofen. These medicines can thin your blood. Do not take these medicines unless your health care provider tells you to take them. Taking over-the-counter medicines, vitamins, herbs, and supplements. General instructions If you will be going home right after the procedure, plan to have a responsible adult: Take you home from the hospital or clinic. You will not be allowed to  drive. Care for you for the time you are told. What happens during the procedure?  An IV will be inserted into one of your veins. You may be given one or more of the following: A medicine to help you relax (sedative). A medicine to numb the throat (local anesthetic). You will lie on your left side on an exam table. Your health care provider will pass the endoscope through your mouth and down your esophagus. Your health care provider will use the scope to check the inside of your esophagus, stomach, and duodenum. Biopsies may be taken. The endoscope will be removed. The procedure may vary among health care providers and hospitals. What happens after the procedure? Your blood pressure, heart rate, breathing rate, and blood oxygen level will be monitored until you leave the hospital or clinic. When your throat is no longer numb, you may be given some fluids to drink. If you were given a sedative during the procedure, it can affect you for several hours. Do not drive or operate machinery until your health care  provider says that it is safe. It is up to you to get the results of your procedure. Ask your health care provider, or the department that is doing the procedure, when your results will be ready. Contact a health care provider if you: Have a sore throat that lasts longer than 1 day. Have a fever. Get help right away if you: Vomit blood or your vomit looks like coffee grounds. Have bloody, black, or tarry stools. Have a very bad sore throat or you cannot swallow. Have difficulty breathing or very bad pain in your chest or abdomen. These symptoms may be an emergency. Get help right away. Call 911. Do not wait to see if the symptoms will go away. Do not drive yourself to the hospital. Summary Upper endoscopy is a procedure to look inside the upper GI tract. During the procedure, an IV will be inserted into one of your veins. You may be given a medicine to help you relax. The endoscope will be passed through your mouth and down your esophagus. Follow instructions from your health care provider about what you can eat and drink. This information is not intended to replace advice given to you by your health care provider. Make sure you discuss any questions you have with your health care provider. Document Revised: 02/23/2022 Document Reviewed: 02/23/2022 Elsevier Patient Education  2023 [employer]  Monitored Anesthesia Care Anesthesia refers to techniques, procedures, and medicines that help a person stay safe and comfortable during a medical or dental procedure. Monitored anesthesia care, or sedation, is one type of anesthesia. Your anesthesia specialist may recommend sedation if you will be having a procedure that does not require you to be unconscious. You may have this procedure for: Cataract surgery. A dental procedure. A biopsy. A colonoscopy. During the procedure, you may receive a medicine to help you relax (sedative). There are three levels of sedation: Mild sedation. At this  level, you may feel awake and relaxed. You will be able to follow directions. Moderate sedation. At this level, you will be sleepy. You may not remember the procedure. Deep sedation. At this level, you will be asleep. You will not remember the procedure. The more medicine you are given, the deeper your level of sedation will be. Depending on how you respond to the procedure, the anesthesia specialist may change your level of sedation or the type of anesthesia to fit your needs. An anesthesia specialist will monitor  you closely during the procedure. Tell a health care provider about: Any allergies you have. All medicines you are taking, including vitamins, herbs, eye drops, creams, and over-the-counter medicines. Any problems you or family members have had with anesthetic medicines. Any blood disorders you have. Any surgeries you have had. Any medical conditions you have, such as sleep apnea. Whether you are pregnant or may be pregnant. Whether you use cigarettes, alcohol, or drugs. Any use of steroids, whether by mouth or as a cream. What are the risks? Generally, this is a safe procedure. However, problems may occur, including: Getting too much medicine (oversedation). Nausea. Allergic reaction to medicines. Trouble breathing. If this happens, a breathing tube may be used to help with breathing. It will be removed when you are awake and breathing on your own. Heart trouble. Lung trouble. Confusion that gets better with time (emergence delirium). What happens before the procedure? Staying hydrated Follow instructions from your health care provider about hydration, which may include: Up to 2 hours before the procedure - you may continue to drink clear liquids, such as water, clear fruit juice, black coffee, and plain tea. Eating and drinking restrictions Follow instructions from your health care provider about eating and drinking, which may include: 8 hours before the procedure - stop  eating heavy meals or foods, such as meat, fried foods, or fatty foods. 6 hours before the procedure - stop eating light meals or foods, such as toast or cereal. 6 hours before the procedure - stop drinking milk or drinks that contain milk. 2 hours before the procedure - stop drinking clear liquids. Medicines Ask your health care provider about: Changing or stopping your regular medicines. This is especially important if you are taking diabetes medicines or blood thinners. Taking medicines such as aspirin and ibuprofen. These medicines can thin your blood. Do not take these medicines unless your health care provider tells you to take them. Taking over-the-counter medicines, vitamins, herbs, and supplements. Tests and exams You will have a physical exam. You may have blood tests done to show: How well your kidneys and liver are working. How well your blood can clot. General instructions Plan to have a responsible adult take you home from the hospital or clinic. If you will be going home right after the procedure, plan to have a responsible adult care for you for the time you are told. This is important. What happens during the procedure?  Your blood pressure, heart rate, breathing, level of pain, and overall condition will be monitored. An IV will be inserted into one of your veins. You will be given medicines as needed to keep you comfortable during the procedure. This may mean changing the level of sedation. Depending on your age or the procedure, the sedative may be given: As a pill that you will swallow or as a pill that is inserted into the rectum. As an injection into the vein or muscle. As a spray through the nose. The procedure will be performed. Your breathing, heart rate, and blood pressure will be monitored during the procedure. When the procedure is over, the medicine will be stopped. The procedure may vary among health care providers and hospitals. What happens after the  procedure? Your blood pressure, heart rate, breathing rate, and blood oxygen level will be monitored until you leave the hospital or clinic. You may feel sleepy, clumsy, or nauseous. You may feel forgetful about what happened after the procedure. You may vomit. You may continue to get IV fluids. Do  not drive or operate machinery until your health care provider says that it is safe. Summary Monitored anesthesia care is used to keep a patient comfortable during short procedures. Tell your health care provider about any allergies or health conditions you have and about all the medicines you are taking. Before the procedure, follow instructions about when to stop eating and drinking and about changing or stopping any medicines. Your blood pressure, heart rate, breathing rate, and blood oxygen level will be monitored until you leave the hospital or clinic. Plan to have a responsible adult take you home from the hospital or clinic. This information is not intended to replace advice given to you by your health care provider. Make sure you discuss any questions you have with your health care provider. Document Revised: 10/19/2021 Document Reviewed: 10/17/2019 Elsevier Patient Education  [employer].

## 2022-08-16 NOTE — Progress Notes (Signed)
Patient cancelled until he follows up with cardiology on Oct 9th 2023 due to an MI with a stent placed in 2018. He is no longer taking his blood thinner because he "ran out". Attempted to obtain clearance from his primary provider but patient does not have one and we were unable to find a provider to accept new patients.

## 2022-08-16 NOTE — Telephone Encounter (Signed)
Please advise below Dr. Carver thanks 

## 2022-08-17 NOTE — Telephone Encounter (Signed)
Pt is seeing cardiology Dr. Wilhemina Cash

## 2022-08-17 NOTE — Telephone Encounter (Signed)
[facility]?

## 2022-08-18 ENCOUNTER — Encounter (HOSPITAL_COMMUNITY): Admission: RE | Payer: Self-pay | Source: Home / Self Care

## 2022-08-18 ENCOUNTER — Ambulatory Visit (HOSPITAL_COMMUNITY): Admission: RE | Admit: 2022-08-18 | Payer: 59 | Source: Home / Self Care

## 2022-08-18 SURGERY — COLONOSCOPY WITH PROPOFOL
Anesthesia: Monitor Anesthesia Care

## 2022-08-23 ENCOUNTER — Ambulatory Visit: Payer: 59 | Admitting: General Surgery

## 2022-09-05 ENCOUNTER — Ambulatory Visit: Payer: 59 | Admitting: Cardiology

## 2022-09-27 ENCOUNTER — Ambulatory Visit: Payer: 59 | Attending: Cardiology | Admitting: Cardiology

## 2022-09-27 NOTE — Progress Notes (Deleted)
Primary Care Provider: Pcp, No [place] Cardiologist: Minus Breeding, MD Electrophysiologist: None  Clinic Note: No chief complaint on file.   ===================================  ASSESSMENT/PLAN   Problem List Items Addressed This Visit   None   ===================================  HPI:    Christopher [name] is a 56 y.o. male smoker with cocaine history with PMH notable for CAD-PCI (while living in [place] in his 32s) who is being seen today for the evaluation of *** at the request of No ref. provider found.  Christopher [name] was last seen on December 15, 2017 by Kerin Ransom, PA as a hospital follow-up => ER visit with upper chest pain.  Positive cocaine.  Minimal troponin elevation.  Normal echo followed up by ETT that was low risk.  He stopped taking his statin because of mild discomfort.  Recent Hospitalizations: ***  Reviewed  CV studies:    The following studies were reviewed today: (if available, images/films reviewed: From Epic Chart or Care Everywhere) ETT 11/30/2017 Echo 11/18/2017   Interval History:   Christopher [name]   CV Review of Symptoms (Summary) Cardiovascular ROS: {roscv:310661}  REVIEWED OF SYSTEMS   ROS  I have reviewed and (if needed) personally updated the patient's problem list, medications, allergies, past medical and surgical history, social and family history.   PAST MEDICAL HISTORY   Past Medical History:  Diagnosis Date   CAD (coronary artery disease)    remote PCI   Cocaine abuse ([facility])    Dyslipidemia    History of kidney stones    NSTEMI (non-ST elevated myocardial infarction) ([facility]) 11/19/2017   in setting of cocaine use   Smoker     PAST SURGICAL HISTORY   Past Surgical History:  Procedure Laterality Date   CORONARY ANGIOPLASTY WITH STENT PLACEMENT      Immunization History  Administered Date(s) Administered   Tdap 02/16/2017    MEDICATIONS/ALLERGIES   No outpatient medications have been marked as taking for  the 09/27/22 encounter (Appointment) with Leonie Man, MD.    Allergies  Allergen Reactions   Hydrocodone Itching   Naproxen Itching   Lipitor [Atorvastatin Calcium] Rash    Pt says he had a rash on his arms with Lipitor and stopped it    SOCIAL HISTORY/FAMILY HISTORY   Reviewed in Epic:   Social History   Tobacco Use   Smoking status: Every Day    Packs/day: 1.00    Types: Cigarettes   Smokeless tobacco: Never  Substance Use Topics   Alcohol use: No   Drug use: Not Currently    Frequency: 1.0 times per week    Types: Cocaine    Comment: past use   Social History   Social History Narrative   Not on file   Family History  Problem Relation Age of Onset   Coronary artery disease Father 24       CABG    OBJCTIVE -PE, EKG, labs   Wt Readings from Last 3 Encounters:  08/16/22 (P) 253 lb 12.8 oz (115.1 kg)  07/28/22 253 lb 12.8 oz (115.1 kg)  07/05/21 250 lb (113.4 kg)    Physical Exam: There were no vitals taken for this visit. Physical Exam   Adult ECG Report  Rate: *** ;  Rhythm: {rhythm:17366};   Narrative Interpretation: ***  Recent Labs:  ***  Lab Results  Component Value Date   CHOL 194 07/05/2021   HDL 39 (L) 07/05/2021   LDLCALC 147 (H) 07/05/2021   TRIG  40 07/05/2021   CHOLHDL 5.0 07/05/2021   Lab Results  Component Value Date   CREATININE 0.86 07/25/2022   BUN 13 07/25/2022   NA 139 07/25/2022   K 4.4 07/25/2022   CL 104 07/25/2022   CO2 26 07/25/2022      Latest Ref Rng & Units 07/25/2022    6:32 PM 11/07/2021   12:51 AM 07/06/2021    3:57 AM  CBC  WBC 4.0 - 10.5 K/uL 7.9  8.4  7.3   Hemoglobin 13.0 - 17.0 g/dL 28.6  38.1  77.1   Hematocrit 39.0 - 52.0 % 50.2  50.1  43.3   Platelets 150 - 400 K/uL 235  260  198     Lab Results  Component Value Date   HGBA1C 5.9 (H) 07/05/2021   No results found for: "TSH"  ================================================== I spent a total of *** minutes with the patient spent in  direct patient consultation.  Additional time spent with chart review  / charting (studies, outside notes, etc): *** min Total Time: *** min  Current medicines are reviewed at length with the patient today.  (+/- concerns) ***  Notice: This dictation was prepared with Dragon dictation along with smart phrase technology. Any transcriptional errors that result from this process are unintentional and may not be corrected upon review.   Studies Ordered:  No orders of the defined types were placed in this encounter.  No orders of the defined types were placed in this encounter.   Patient Instructions / Medication Changes & Studies & Tests Ordered   There are no Patient Instructions on file for this visit.    Marykay Lex, MD, MS Bryan Lemma, M.D., M.S. Interventional Cardiologist  [facility]  Pager # [phone] Phone # [phone] [address] [place], [place] [address]   Thank you for choosing [facility] [place]!!

## 2022-10-06 ENCOUNTER — Telehealth: Payer: Self-pay | Admitting: *Deleted

## 2022-10-06 ENCOUNTER — Telehealth: Payer: Self-pay | Admitting: Licensed Clinical Social Worker

## 2022-10-06 ENCOUNTER — Ambulatory Visit: Payer: 59 | Attending: Internal Medicine | Admitting: Internal Medicine

## 2022-10-06 ENCOUNTER — Encounter: Payer: Self-pay | Admitting: Internal Medicine

## 2022-10-06 VITALS — BP 124/82 | HR 77 | Ht 69.0 in | Wt 252.0 lb

## 2022-10-06 DIAGNOSIS — I214 Non-ST elevation (NSTEMI) myocardial infarction: Secondary | ICD-10-CM

## 2022-10-06 DIAGNOSIS — I252 Old myocardial infarction: Secondary | ICD-10-CM

## 2022-10-06 DIAGNOSIS — Z01810 Encounter for preprocedural cardiovascular examination: Secondary | ICD-10-CM

## 2022-10-06 MED ORDER — EZETIMIBE 10 MG PO TABS: 10.0000 mg | ORAL_TABLET | Freq: Every day | ORAL | 5 refills | Status: AC

## 2022-10-06 MED ORDER — ASPIRIN 81 MG PO TBEC: 81.0000 mg | DELAYED_RELEASE_TABLET | Freq: Every day | ORAL | 3 refills | Status: AC

## 2022-10-06 MED ORDER — NITROGLYCERIN 0.4 MG SL SUBL: 0.4000 mg | SUBLINGUAL_TABLET | SUBLINGUAL | 3 refills | Status: AC | PRN

## 2022-10-06 NOTE — Progress Notes (Signed)
Cardiology Office Note:    Date:  10/06/2022   ID:  Christopher [name], DOB 23-Sep-1966, MRN [id number]  PCP:  Aviva Kluver   [facility] Providers Cardiologist:  Rollene Rotunda, MD     Referring MD: No ref. provider found   No chief complaint on file. Establish care  History of Present Illness:    Christopher [name] is a 56 y.o. male with a hx of cocaine use, smoker,  NSTEMI , per documentation  PCI in [place] when he was in his 20's related to cocaine, 10/2017 had NSTEMI that was medically managed. Had FU POET that was low risk. His EF is normal from 2018; no valve dx, was planned for colonoscopy but canceled since he ran out of his cardiac meds.  He has dark stool and rectal bleeding. He can go up stairs without SOB or CP.  No recent hospitalizations. Mother- MI and stroke; and father has hx of Mis.  Past Medical History:  Diagnosis Date   CAD (coronary artery disease)    remote PCI   Cocaine abuse ([facility])    Dyslipidemia    History of kidney stones    NSTEMI (non-ST elevated myocardial infarction) ([facility]) 11/19/2017   in setting of cocaine use   Smoker     Past Surgical History:  Procedure Laterality Date   CORONARY ANGIOPLASTY WITH STENT PLACEMENT      Current Medications: No outpatient medications have been marked as taking for the 10/06/22 encounter (Appointment) with Maisie Fus, MD.     Allergies:   Hydrocodone, Naproxen, and Lipitor [atorvastatin calcium]   Social History   Socioeconomic History   Marital status: Married    Spouse name: Not on file   Number of children: Not on file   Years of education: Not on file   Highest education level: Not on file  Occupational History   Not on file  Tobacco Use   Smoking status: Every Day    Packs/day: 1.00    Types: Cigarettes   Smokeless tobacco: Never  Substance and Sexual Activity   Alcohol use: No   Drug use: Not Currently    Frequency: 1.0 times per week    Types: Cocaine    Comment: past use   Sexual  activity: Yes  Other Topics Concern   Not on file  Social History Narrative   Not on file   Social Determinants of Health   Financial Resource Strain: Not on file  Food Insecurity: Not on file  Transportation Needs: Not on file  Physical Activity: Not on file  Stress: Not on file  Social Connections: Not on file     Family History: The patient's family history includes Coronary artery disease (age of onset: 33) in his father. Mother- MI and stroke; and father has hx of multiple Mis.  ROS:   Please see the history of present illness.     All other systems reviewed and are negative.  EKGs/Labs/Other Studies Reviewed:    The following studies were reviewed today:   EKG:  EKG is  ordered today.  The ekg ordered today demonstrates   11/9- NSR  Recent Labs: 07/25/2022: ALT 46; BUN 13; Creatinine, Ser 0.86; Hemoglobin 17.2; Platelets 235; Potassium 4.4; Sodium 139   Recent Lipid Panel    Component Value Date/Time   CHOL 194 07/05/2021 0954   TRIG 40 07/05/2021 0954   HDL 39 (L) 07/05/2021 0954   CHOLHDL 5.0 07/05/2021 0954   VLDL 8 07/05/2021 0954  LDLCALC 147 (H) 07/05/2021 0954     Risk Assessment/Calculations:    Physical Exam:    VS:   Vitals:   10/06/22 1014  BP: 124/82  Pulse: 77  SpO2: 95%     Wt Readings from Last 3 Encounters:  08/16/22 (P) 253 lb 12.8 oz (115.1 kg)  07/28/22 253 lb 12.8 oz (115.1 kg)  07/05/21 250 lb (113.4 kg)     GEN:  Well nourished, well developed in no acute distress HEENT: Normal NECK: No JVD; No carotid bruits LYMPHATICS: No lymphadenopathy CARDIAC: RRR, no murmurs, rubs, gallops RESPIRATORY:  Clear to auscultation without rales, wheezing or rhonchi  ABDOMEN: Soft, non-tender, non-distended MUSCULOSKELETAL:  No edema; No deformity  SKIN: Warm and dry NEUROLOGIC:  Alert and oriented x 3 PSYCHIATRIC:  Normal affect   ASSESSMENT:    CAD: hx of NSTEMI in the setting of cocaine use. EF is normal. - ASA 81 mg  daily - pt stated had a rash with lipitor per EHR; pt didn't recall. Will start with zetia and check lipids on FU - nitro SL - will refill  Pre-OP: can do > 4 METS without symptoms. RCRI Class I low risk for adverse CVD events with his procedure - acceptable cardiac risk for colonoscopy  HTN: blood pressure well control. Can continue to monitor  PLAN:    In order of problems listed above:  Aspirin 81 mg daily Zetia 10 mg daily Nitro SL PRN Patient assistance (sending a message) Provided information for obtaining a primary care doctor  Acceptable cardiac risk for colonoscopy Follow up 6 months           Medication Adjustments/Labs and Tests Ordered: Current medicines are reviewed at length with the patient today.  Concerns regarding medicines are outlined above.  No orders of the defined types were placed in this encounter.  No orders of the defined types were placed in this encounter.   There are no Patient Instructions on file for this visit.   Signed, Maisie Fus, MD  10/06/2022 7:59 AM    [facility]

## 2022-10-06 NOTE — Telephone Encounter (Signed)
Pt wanted to let you know he was seen by cardiology today so that he can be scheduled for colonoscopy. Please advise. Thank you

## 2022-10-06 NOTE — Telephone Encounter (Signed)
H&V Care Navigation CSW Progress Note  Clinical Social Worker contacted patient by phone to f/u on concerns related to medication affordability. LCSW was able to reach [facility] and speak with pharmacy team. Pt currently has no copay for any medications sent to their pharmacy. LCSW called pt who was updated with the following. Pt shares he does not have any employment or any current primary care physician. LCSW will mail pt list of clinics. He was encouraged to reach out if any additional questions/concerns.   Patient is participating in a Managed Medicaid Plan:  No, Engineer, building services only.   SDOH Screenings   Financial Resource Strain: Medium Risk (10/06/2022)  Tobacco Use: High Risk (10/06/2022)   Christopher [name], MSW, LCSW Clinical Social Worker II [facility] Heart/Vascular Care Navigation  [phone]- work cell phone (preferred) [phone]- desk phone

## 2022-10-06 NOTE — Patient Instructions (Addendum)
Medication Instructions:   START: ASPIRIN 81mg  ONCE DAILY   START: ZETIA 10mg  ONCE DAILY   NITROGLYCERIN UNDER THE TONGUE AS NEEDED FOR CHEST PAIN. YOU MAY TAKE 1 DOSE UNDER THE TONGUE EVERY 5 MINUTES AS NEEDED. DO NOT TAKE MORE THAN 3 DOSES IN 15 MINS    NITROGLYCERIN  is a type of vasodilator. It relaxes blood vessels, increasing the blood and oxygen supply to your heart. This medicine is used to relieve chest pain caused by angina.  In an angina attack (CHEST PAIN), you should feel better within 5 minutes after your first dose. Do not swallow whole. Place tablet under your tongue. Sit down when taking this medicine. You can take a dose every 5 minutes up to a total of 3 doses. If you do not feel better or feel worse after 1 dose, call 9-1-1 at once. Do not take more than 3 doses in 15 minutes. Do not take your medicine more often than directed.  *If you need a refill on your cardiac medications before your next appointment, please call your pharmacy*  Lab Work: None Ordered At This Time.  If you have labs (blood work) drawn today and your tests are completely normal, you will receive your results only by: MyChart Message (if you have MyChart) OR A paper copy in the mail If you have any lab test that is abnormal or we need to change your treatment, we will call you to review the results.  Testing/Procedures: None Ordered At This Time.   Follow-Up: At [facility], you and your health needs are our priority.  As part of our continuing mission to provide you with exceptional heart care, we have created designated Provider Care Teams.  These Care Teams include your primary Cardiologist (physician) and Advanced Practice Providers (APPs -  Physician Assistants and Nurse Practitioners) who all work together to provide you with the care you need, when you need it.  Your next appointment:   6 month(s)  The format for your next appointment:   In Person  Provider:   ,  MD

## 2022-10-07 NOTE — Telephone Encounter (Signed)
Okay to schedule for EGD/CLN, thanks

## 2022-10-10 NOTE — Telephone Encounter (Signed)
Tried calling pt to schedule TCS/EGD with Dr. Marletta Lor ASA 3 but both #'s not currently working.

## 2022-10-11 ENCOUNTER — Encounter: Payer: Self-pay | Admitting: *Deleted

## 2022-10-13 NOTE — Telephone Encounter (Signed)
Pt is scheduled for 10/28/22 at 1:45 pm. Instructions mailed and prep sent to the pharmacy.

## 2022-10-25 ENCOUNTER — Encounter: Payer: Self-pay | Admitting: General Surgery

## 2022-10-25 ENCOUNTER — Ambulatory Visit: Payer: 59 | Admitting: General Surgery

## 2022-10-25 VITALS — BP 134/88 | HR 72 | Temp 97.7°F | Resp 14 | Ht 69.0 in | Wt 255.0 lb

## 2022-10-25 DIAGNOSIS — K429 Umbilical hernia without obstruction or gangrene: Secondary | ICD-10-CM | POA: Diagnosis not present

## 2022-10-25 DIAGNOSIS — K4020 Bilateral inguinal hernia, without obstruction or gangrene, not specified as recurrent: Secondary | ICD-10-CM | POA: Diagnosis not present

## 2022-10-25 NOTE — Progress Notes (Signed)
[facility] History and Physical  Reason for Referral:*** Referring Physician: ***  Chief Complaint   New Patient (Initial Visit)     Christopher [name] is a 56 y.o. male.  HPI: ***.  The *** started *** and has had a duration of ***.  It is associated with ***.  The *** is improved with ***, and is made worse with ***.    Quality*** Context***  Past Medical History:  Diagnosis Date  . CAD (coronary artery disease)    remote PCI  . Cocaine abuse ([facility])   . Dyslipidemia   . History of kidney stones   . NSTEMI (non-ST elevated myocardial infarction) ([facility]) 11/19/2017   in setting of cocaine use  . Smoker     Past Surgical History:  Procedure Laterality Date  . CORONARY ANGIOPLASTY WITH STENT PLACEMENT      Family History  Problem Relation Age of Onset  . Coronary artery disease Father 24       CABG    Social History   Tobacco Use  . Smoking status: Every Day    Packs/day: 1.00    Types: Cigarettes  . Smokeless tobacco: Never  Substance Use Topics  . Alcohol use: No  . Drug use: Not Currently    Frequency: 1.0 times per week    Types: Cocaine    Comment: past use    Medications: {medication reviewed/display:3041432} Allergies as of 10/25/2022       Reactions   Hydrocodone Itching   Naproxen Itching   Lipitor [atorvastatin Calcium] Rash   Pt says he had a rash on his arms with Lipitor and stopped it        Medication List        Accurate as of October 25, 2022 10:41 AM. If you have any questions, ask your nurse or doctor.          aspirin EC 81 MG tablet Take 1 tablet (81 mg total) by mouth daily. Swallow whole.   ezetimibe 10 MG tablet Commonly known as: Zetia Take 1 tablet (10 mg total) by mouth daily.   nitroGLYCERIN 0.4 MG SL tablet Commonly known as: NITROSTAT Place 1 tablet (0.4 mg total) under the tongue every 5 (five) minutes as needed for chest pain. DO NOT TAKE MORE THAN 3 DOSES IN 15 MINUTES         ROS:   {Review of Systems:30496}  Blood pressure 134/88, pulse 72, temperature 97.7 F (36.5 C), temperature source Oral, resp. rate 14, height 5\' 9"  (1.753 m), weight 255 lb (115.7 kg), SpO2 96 %. Physical Exam  Results: No results found for this or any previous visit (from the past 48 hour(s)).  No results found.   Assessment & Plan:  Christopher [name] is a 56 y.o. male with *** -*** -*** -Follow up ***  All questions were answered to the satisfaction of the patient and family***.  The risk and benefits of *** were discussed including but not limited to ***.  After careful consideration, Christopher [name] has decided to ***.    Christopher [name] 10/25/2022, 10:41 AM

## 2022-10-25 NOTE — Patient Instructions (Addendum)
We will see what your colonoscopy shows on Friday and then can work to get you scheduled on Monday if that is all good.  Can plan to fix your umbilical hernia at the same time as your inguinal hernia, can do a small open repair with possible mesh.    Laparoscopic Robotic Assisted Inguinal Hernia Repair, Adult Laparoscopic inguinal hernia repair is a surgical procedure to repair a small, weak spot in the groin muscles that allows fat or intestines from inside the abdomen to bulge out (inguinal hernia). This procedure may be planned, or it may be an emergency procedure. During the procedure, tissue that has bulged out is moved back into place, and the opening in the groin muscles is repaired. This is done through three small incisions in the abdomen. A thin tube with a light and camera on the end (laparoscope) is used to help perform the procedure. Tell a health care provider about: Any allergies you have. All medicines you are taking, including vitamins, herbs, eye drops, creams, and over-the-counter medicines. Any problems you or family members have had with anesthetic medicines. Any blood disorders you have. Any surgeries you have had. Any medical conditions you have. Whether you are pregnant or may be pregnant. What are the risks? Generally, this is a safe procedure. However, problems may occur, including: Infection. Bleeding. Allergic reactions to medicines. Damage to nearby structures or organs. Testicle damage or long-term pain and swelling of the scrotum, in males. Inability to completely empty the bladder (urinary retention). Blood clots. A collection of fluid that builds up under the skin (seroma). The hernia coming back (recurrence). What happens before the procedure? Medicines Ask your health care provider about: Changing or stopping your regular medicines. This is especially important if you are taking diabetes medicines or blood thinners. Taking medicines such as aspirin and  ibuprofen. These medicines can thin your blood. Do not take these medicines unless your health care provider tells you to take them. Taking over-the-counter medicines, vitamins, herbs, and supplements. General instructions Do not use any products that contain nicotine or tobacco for at least 4 weeks before the procedure, if possible. These products include cigarettes, chewing tobacco, and vaping devices, such as e-cigarettes. If you need help quitting, ask your health care provider. Ask your health care provider: How your surgery site will be marked. What steps will be taken to help prevent infection. These steps may include: Removing hair at the surgery site. Washing skin with a germ-killing soap. Taking antibiotic medicine. Plan to have a responsible adult take you home from the hospital or clinic. Plan to have a responsible adult care for you for the time you are told after you leave the hospital or clinic. This is important. What happens during the procedure? An IV will be inserted into one of your veins. You will be given one or more of the following: A medicine to help you relax (sedative). A medicine to make you fall asleep (general anesthetic). Three small incisions will be made in your abdomen. Your abdomen will be inflated with carbon dioxide gas to make the surgical area easier to see. A laparoscope and surgical instruments will be inserted through the incisions. The laparoscope will send images of the inside of your abdomen to a monitor in the room. Tissue that is bulging through the hernia may be removed or moved back into place. The hernia opening will be closed with a sheet of surgical mesh. The surgical instruments and laparoscope will be removed. Your incisions will  be closed with stitches (sutures) and adhesive strips. A bandage (dressing) will be placed over your incisions. The procedure may vary among health care providers and hospitals. What happens after the  procedure? Your blood pressure, heart rate, breathing rate, and blood oxygen level will be monitored until you leave the hospital or clinic. You will be given pain medicine as needed. You may continue to receive medicines and fluids through an IV. The IV will be removed after you can drink fluids. You will be encouraged to get up and move around and to take deep breaths frequently. If you were given a sedative during the procedure, it can affect you for several hours. Do not drive or operate machinery until your health care provider says that it is safe. Summary Laparoscopic inguinal hernia repair is a surgical procedure to repair a small, weak spot in the groin muscles that allows fat or intestines from inside the abdomen to bulge out (inguinal hernia). This procedure is done through three small incisions in the abdomen. A thin tube with a light and camera on the end (laparoscope) is used to help perform the procedure. After the procedure, you will be encouraged to get up and move around and to take deep breaths frequently. This information is not intended to replace advice given to you by your health care provider. Make sure you discuss any questions you have with your health care provider. Umbilical Hernia, Adult  A hernia is a bulge of tissue that pushes through an opening between muscles. An umbilical hernia happens in the abdomen, near the belly button (umbilicus). The hernia may contain tissues from the small intestine, large intestine, or fatty tissue covering the intestines. Umbilical hernias in adults tend to get worse over time, and they require surgical treatment. There are different types of umbilical hernias, including: Indirect hernia. This type is located just above or below the umbilicus. It is the most common type of umbilical hernia in adults. Direct hernia. This type forms through an opening formed by the umbilicus. Reducible hernia. This type of hernia comes and goes. It may be  visible only when you strain, lift something heavy, or cough. This type of hernia can be pushed back into the abdomen (reduced). Incarcerated hernia. This type traps abdominal tissue inside the hernia. This type of hernia cannot be reduced. Strangulated hernia. This type of hernia cuts off blood flow to the tissues inside the hernia. The tissues can start to die if this happens. This type of hernia requires emergency treatment. What are the causes? An umbilical hernia happens when tissue inside the abdomen presses on a weak area of the abdominal muscles. What increases the risk? You may have a greater risk of this condition if you: Are obese. Have had several pregnancies. Have a buildup of fluid inside your abdomen. Have had surgery that weakens the abdominal muscles. What are the signs or symptoms? The main symptom of this condition is a painless bulge at or near the belly button. A reducible hernia may be visible only when you strain, lift something heavy, or cough. Other symptoms may include: Dull pain. A feeling of pressure. Symptoms of a strangulated hernia may include: Pain that gets increasingly worse. Nausea and vomiting. Pain when pressing on the hernia. Skin over the hernia becoming red or purple. Constipation. Blood in the stool. How is this diagnosed? This condition may be diagnosed based on: A physical exam. You may be asked to cough or strain while standing. These actions increase the pressure  inside your abdomen and can force the hernia through the opening in your muscles. Your health care provider may try to reduce the hernia by pressing on it. Your symptoms and medical history. How is this treated? Surgery is the only treatment for an umbilical hernia. Surgery for a strangulated hernia is done as soon as possible. If you have a small hernia that is not incarcerated, you may need to lose weight before having surgery. Follow these instructions at home: Lose weight, if  told by your health care provider. Do not try to push the hernia back in. Watch your hernia for any changes in color or size. Tell your health care provider if any changes occur. You may need to avoid activities that increase pressure on your hernia. Do not lift anything that is heavier than 10 lb (4.5 kg), or the limit that you are told, until your health care provider says that it is safe. Take over-the-counter and prescription medicines only as told by your health care provider. Keep all follow-up visits. This is important. Contact a health care provider if: Your hernia gets larger. Your hernia becomes painful. Get help right away if: You develop sudden, severe pain near the area of your hernia. You have pain as well as nausea or vomiting. You have pain and the skin over your hernia changes color. You develop a fever or chills. Summary A hernia is a bulge of tissue that pushes through an opening between muscles. An umbilical hernia happens near the belly button. Surgery is the only treatment for an umbilical hernia. Do not try to push your hernia back in. Keep all follow-up visits. This is important. This information is not intended to replace advice given to you by your health care provider. Make sure you discuss any questions you have with your health care provider. Document Revised: 06/22/2020 Document Reviewed: 06/22/2020 Elsevier Patient Education  2023 [employer]  Document Revised: 07/14/2020 Document Reviewed: 07/14/2020 Elsevier Patient Education  2023 [employer].

## 2022-10-26 ENCOUNTER — Encounter (HOSPITAL_COMMUNITY): Payer: Self-pay

## 2022-10-26 ENCOUNTER — Other Ambulatory Visit: Payer: Self-pay

## 2022-10-26 ENCOUNTER — Encounter (HOSPITAL_COMMUNITY)
Admission: RE | Admit: 2022-10-26 | Discharge: 2022-10-26 | Disposition: A | Discharge status: Home / Self Care | Payer: 59 | Source: Ambulatory Visit | Attending: Internal Medicine | Admitting: Internal Medicine

## 2022-10-26 NOTE — Pre-Procedure Instructions (Signed)
Attempted pre-op phone call. Left VM for him to call us back. 

## 2022-10-28 ENCOUNTER — Ambulatory Visit (HOSPITAL_COMMUNITY): Payer: Medicaid Other | Admitting: Anesthesiology

## 2022-10-28 ENCOUNTER — Ambulatory Visit (HOSPITAL_BASED_OUTPATIENT_CLINIC_OR_DEPARTMENT_OTHER): Payer: Medicaid Other | Admitting: Anesthesiology

## 2022-10-28 ENCOUNTER — Encounter (HOSPITAL_COMMUNITY)
Admission: RE | Disposition: A | Discharge status: Home / Self Care | Payer: Self-pay | Source: Home / Self Care | Attending: Internal Medicine

## 2022-10-28 ENCOUNTER — Ambulatory Visit (HOSPITAL_COMMUNITY)
Admission: RE | Admit: 2022-10-28 | Discharge: 2022-10-28 | Disposition: A | Discharge status: Home / Self Care | Payer: Medicaid Other | Attending: Internal Medicine | Admitting: Internal Medicine

## 2022-10-28 DIAGNOSIS — K2970 Gastritis, unspecified, without bleeding: Secondary | ICD-10-CM | POA: Diagnosis not present

## 2022-10-28 DIAGNOSIS — Z5986 Financial insecurity: Secondary | ICD-10-CM | POA: Insufficient documentation

## 2022-10-28 DIAGNOSIS — Z1212 Encounter for screening for malignant neoplasm of rectum: Secondary | ICD-10-CM

## 2022-10-28 DIAGNOSIS — K922 Gastrointestinal hemorrhage, unspecified: Secondary | ICD-10-CM | POA: Diagnosis not present

## 2022-10-28 DIAGNOSIS — K635 Polyp of colon: Secondary | ICD-10-CM | POA: Diagnosis not present

## 2022-10-28 DIAGNOSIS — K5730 Diverticulosis of large intestine without perforation or abscess without bleeding: Secondary | ICD-10-CM | POA: Diagnosis not present

## 2022-10-28 DIAGNOSIS — K2980 Duodenitis without bleeding: Secondary | ICD-10-CM

## 2022-10-28 DIAGNOSIS — Z139 Encounter for screening, unspecified: Secondary | ICD-10-CM | POA: Diagnosis not present

## 2022-10-28 DIAGNOSIS — F17210 Nicotine dependence, cigarettes, uncomplicated: Secondary | ICD-10-CM | POA: Diagnosis not present

## 2022-10-28 DIAGNOSIS — K449 Diaphragmatic hernia without obstruction or gangrene: Secondary | ICD-10-CM | POA: Diagnosis not present

## 2022-10-28 DIAGNOSIS — K2990 Gastroduodenitis, unspecified, without bleeding: Secondary | ICD-10-CM | POA: Diagnosis not present

## 2022-10-28 DIAGNOSIS — K649 Unspecified hemorrhoids: Secondary | ICD-10-CM | POA: Diagnosis not present

## 2022-10-28 DIAGNOSIS — Z1211 Encounter for screening for malignant neoplasm of colon: Secondary | ICD-10-CM | POA: Diagnosis not present

## 2022-10-28 DIAGNOSIS — K219 Gastro-esophageal reflux disease without esophagitis: Secondary | ICD-10-CM | POA: Insufficient documentation

## 2022-10-28 DIAGNOSIS — D122 Benign neoplasm of ascending colon: Secondary | ICD-10-CM | POA: Diagnosis not present

## 2022-10-28 DIAGNOSIS — Z955 Presence of coronary angioplasty implant and graft: Secondary | ICD-10-CM | POA: Diagnosis not present

## 2022-10-28 DIAGNOSIS — K921 Melena: Secondary | ICD-10-CM | POA: Insufficient documentation

## 2022-10-28 HISTORY — PX: ESOPHAGOGASTRODUODENOSCOPY (EGD) WITH PROPOFOL: SHX5813

## 2022-10-28 HISTORY — PX: BIOPSY: SHX5522

## 2022-10-28 HISTORY — PX: COLONOSCOPY WITH PROPOFOL: SHX5780

## 2022-10-28 HISTORY — PX: POLYPECTOMY: SHX5525

## 2022-10-28 SURGERY — COLONOSCOPY WITH PROPOFOL
Anesthesia: General

## 2022-10-28 MED ORDER — PROPOFOL 10 MG/ML IV BOLUS
INTRAVENOUS | Status: DC | PRN
  Administered 2022-10-28 (×2): 50 mg via INTRAVENOUS

## 2022-10-28 MED ORDER — LIDOCAINE HCL (CARDIAC) PF 100 MG/5ML IV SOSY
PREFILLED_SYRINGE | INTRAVENOUS | Status: DC | PRN
  Administered 2022-10-28: 100 mg via INTRAVENOUS

## 2022-10-28 MED ORDER — PANTOPRAZOLE SODIUM 40 MG PO TBEC: 40.0000 mg | DELAYED_RELEASE_TABLET | Freq: Two times a day (BID) | ORAL | 11 refills | Status: DC

## 2022-10-28 MED ORDER — LACTATED RINGERS IV SOLN: INTRAVENOUS | Status: DC

## 2022-10-28 MED ORDER — PROPOFOL 500 MG/50ML IV EMUL
INTRAVENOUS | Status: DC | PRN
  Administered 2022-10-28: 150 ug/kg/min via INTRAVENOUS

## 2022-10-28 MED ORDER — PHENYLEPHRINE HCL (PRESSORS) 10 MG/ML IV SOLN
INTRAVENOUS | Status: DC | PRN
  Administered 2022-10-28 (×2): 160 ug via INTRAVENOUS

## 2022-10-28 NOTE — Anesthesia Postprocedure Evaluation (Signed)
Anesthesia Post Note  Patient: ERMIN PARISIEN  Procedure(s) Performed: COLONOSCOPY WITH PROPOFOL ESOPHAGOGASTRODUODENOSCOPY (EGD) WITH PROPOFOL BIOPSY POLYPECTOMY  Patient location during evaluation: Phase II Anesthesia Type: General Level of consciousness: awake and alert and oriented Pain management: pain level controlled Vital Signs Assessment: post-procedure vital signs reviewed and stable Respiratory status: spontaneous breathing, nonlabored ventilation and respiratory function stable Cardiovascular status: blood pressure returned to baseline and stable Postop Assessment: no apparent nausea or vomiting Anesthetic complications: no  No notable events documented.   Last Vitals:  Vitals:   10/28/22 0656 10/28/22 0807  BP: 112/77 (!) 96/56  Pulse: 68 69  Resp: 14 17  Temp: 36.9 C 36.5 C  SpO2: 97% 93%    Last Pain:  Vitals:   10/28/22 0807  TempSrc: Oral  PainSc: 0-No pain                 Latanza Pfefferkorn C Jannely Henthorn

## 2022-10-28 NOTE — Op Note (Signed)
[facility] Patient Name: Christopher [name] Procedure Date: 10/28/2022 7:25 AM MRN: [id number] Date of Birth: [birth year] Attending MD: Elon Alas. Abbey Chatters , [place], [id number] CSN: [id number] Age: 56 Admit Type: Outpatient Procedure:                Upper GI endoscopy Indications:              Heartburn, Melena Providers:                Elon Alas. Abbey Chatters, DO, Tammy Vaught, RN, Everardo Pacific Referring MD:              Medicines:                See the Anesthesia note for documentation of the                            administered medications Complications:            No immediate complications. Estimated Blood Loss:     Estimated blood loss was minimal. Procedure:                Pre-Anesthesia Assessment:                           - The anesthesia plan was to use monitored                            anesthesia care (MAC).                           After obtaining informed consent, the endoscope was                            passed under direct vision. Throughout the                            procedure, the patient's blood pressure, pulse, and                            oxygen saturations were monitored continuously. The                            GIF-H190 ([id number]) scope was introduced through the                            mouth, and advanced to the second part of duodenum.                            The upper GI endoscopy was accomplished without                            difficulty. The patient tolerated the procedure                            well. Scope In: 7:39:02 AM Scope  Out: 7:42:36 AM Total Procedure Duration: 0 hours 3 minutes 34 seconds  Findings:      The Z-line was regular and was found 40 cm from the incisors.      A 2 cm hiatal hernia was present.      Diffuse moderate inflammation characterized by erosions and erythema was       found in the entire examined stomach. Biopsies were taken with a cold       forceps for Helicobacter pylori  testing.      Localized mild inflammation characterized by erythema was found in the       duodenal bulb.      The second portion of the duodenum was normal. Impression:               - Z-line regular, 40 cm from the incisors.                           - 2 cm hiatal hernia.                           - Gastritis. Biopsied.                           - Duodenitis.                           - Normal second portion of the duodenum. Moderate Sedation:      Per Anesthesia Care Recommendation:           - Patient has a contact number available for                            emergencies. The signs and symptoms of potential                            delayed complications were discussed with the                            patient. Return to normal activities tomorrow.                            Written discharge instructions were provided to the                            patient.                           - Resume previous diet.                           - Continue present medications.                           - Await pathology results.                           - Use Protonix (pantoprazole) 40 mg PO BID for 12  weeks then decrease down to once daily as tolerated                           - Return to GI clinic in 3 months. Procedure Code(s):        --- Professional ---                           [phone], Esophagogastroduodenoscopy, flexible,                            transoral; with biopsy, single or multiple Diagnosis Code(s):        --- Professional ---                           K44.9, Diaphragmatic hernia without obstruction or                            gangrene                           K29.70, Gastritis, unspecified, without bleeding                           K29.80, Duodenitis without bleeding                           R12, Heartburn                           K92.1, Melena (includes Hematochezia) CPT copyright 2022 American Medical Association. All rights  reserved. The codes documented in this report are preliminary and upon coder review may  be revised to meet current compliance requirements. Elon Alas. Abbey Chatters, DO Shaniko Abbey Chatters, DO 10/28/2022 7:44:57 AM This report has been signed electronically. Number of Addenda: 0

## 2022-10-28 NOTE — Transfer of Care (Signed)
Immediate Anesthesia Transfer of Care Note  Patient: Christopher [name]  Procedure(s) Performed: COLONOSCOPY WITH PROPOFOL ESOPHAGOGASTRODUODENOSCOPY (EGD) WITH PROPOFOL BIOPSY POLYPECTOMY  Patient Location: Short Stay  Anesthesia Type:General  Level of Consciousness: awake, alert , oriented, and patient cooperative  Airway & Oxygen Therapy: Patient Spontanous Breathing  Post-op Assessment: Report given to RN, Post -op Vital signs reviewed and stable, and Patient moving all extremities X 4  Post vital signs: Reviewed and stable  Last Vitals:  Vitals Value Taken Time  BP 96/56 10/28/22 0807  Temp 36.5 C 10/28/22 0807  Pulse 69 10/28/22 0807  Resp 17 10/28/22 0807  SpO2 93 % 10/28/22 0807    Last Pain:  Vitals:   10/28/22 0807  TempSrc: Oral  PainSc: 0-No pain      Patients Stated Pain Goal: 6 (10/28/22 0656)  Complications: No notable events documented.

## 2022-10-28 NOTE — Discharge Instructions (Signed)
EGD Discharge instructions Please read the instructions outlined below and refer to this sheet in the next few weeks. These discharge instructions provide you with general information on caring for yourself after you leave the hospital. Your doctor may also give you specific instructions. While your treatment has been planned according to the most current medical practices available, unavoidable complications occasionally occur. If you have any problems or questions after discharge, please call your doctor. ACTIVITY You may resume your regular activity but move at a slower pace for the next 24 hours.  Take frequent rest periods for the next 24 hours.  Walking will help expel (get rid of) the air and reduce the bloated feeling in your abdomen.  No driving for 24 hours (because of the anesthesia (medicine) used during the test).  You may shower.  Do not sign any important legal documents or operate any machinery for 24 hours (because of the anesthesia used during the test).  NUTRITION Drink plenty of fluids.  You may resume your normal diet.  Begin with a light meal and progress to your normal diet.  Avoid alcoholic beverages for 24 hours or as instructed by your caregiver.  MEDICATIONS You may resume your normal medications unless your caregiver tells you otherwise.  WHAT YOU CAN EXPECT TODAY You may experience abdominal discomfort such as a feeling of fullness or "gas" pains.  FOLLOW-UP Your doctor will discuss the results of your test with you.  SEEK IMMEDIATE MEDICAL ATTENTION IF ANY OF THE FOLLOWING OCCUR: Excessive nausea (feeling sick to your stomach) and/or vomiting.  Severe abdominal pain and distention (swelling).  Trouble swallowing.  Temperature over 101 F (37.8 C).  Rectal bleeding or vomiting of blood.    Colonoscopy Discharge Instructions  Read the instructions outlined below and refer to this sheet in the next few weeks. These discharge instructions provide you with  general information on caring for yourself after you leave the hospital. Your doctor may also give you specific instructions. While your treatment has been planned according to the most current medical practices available, unavoidable complications occasionally occur.   ACTIVITY You may resume your regular activity, but move at a slower pace for the next 24 hours.  Take frequent rest periods for the next 24 hours.  Walking will help get rid of the air and reduce the bloated feeling in your belly (abdomen).  No driving for 24 hours (because of the medicine (anesthesia) used during the test).   Do not sign any important legal documents or operate any machinery for 24 hours (because of the anesthesia used during the test).  NUTRITION Drink plenty of fluids.  You may resume your normal diet as instructed by your doctor.  Begin with a light meal and progress to your normal diet. Heavy or fried foods are harder to digest and may make you feel sick to your stomach (nauseated).  Avoid alcoholic beverages for 24 hours or as instructed.  MEDICATIONS You may resume your normal medications unless your doctor tells you otherwise.  WHAT YOU CAN EXPECT TODAY Some feelings of bloating in the abdomen.  Passage of more gas than usual.  Spotting of blood in your stool or on the toilet paper.  IF YOU HAD POLYPS REMOVED DURING THE COLONOSCOPY: No aspirin products for 7 days or as instructed.  No alcohol for 7 days or as instructed.  Eat a soft diet for the next 24 hours.  FINDING OUT THE RESULTS OF YOUR TEST Not all test results are available  during your visit. If your test results are not back during the visit, make an appointment with your caregiver to find out the results. Do not assume everything is normal if you have not heard from your caregiver or the medical facility. It is important for you to follow up on all of your test results.  SEEK IMMEDIATE MEDICAL ATTENTION IF: You have more than a spotting of  blood in your stool.  Your belly is swollen (abdominal distention).  You are nauseated or vomiting.  You have a temperature over 101.  You have abdominal pain or discomfort that is severe or gets worse throughout the day.   Your EGD revealed moderate amount inflammation in your stomach with erosions as well as the first portion of your small bowel.  I took biopsies of this to rule out infection with a bacteria called H. pylori.  Await pathology results, my office will contact you.  I am going to start you on a new medication called pantoprazole 40 mg twice daily for the next 12 weeks at which point you can decrease down to once daily as tolerated.  Your colonoscopy revealed 3 polyp(s) which I removed successfully. Await pathology results, my office will contact you. I recommend repeating colonoscopy in 5 years for surveillance purposes.   Okay to proceed with inguinal hernia repair. I will let Dr. Henreitta Leber know my findings  Follow up with GI in 3-4 months  I hope you have a great rest of your week!  Hennie Duos. Marletta Lor, D.O. Gastroenterology and Hepatology [facility]

## 2022-10-28 NOTE — Anesthesia Preprocedure Evaluation (Signed)
Anesthesia Evaluation  Patient identified by MRN, date of birth, ID band Patient awake    Reviewed: Allergy & Precautions, H&P , NPO status , Patient's Chart, lab work & pertinent test results  History of Anesthesia Complications Negative for: history of anesthetic complications  Airway Mallampati: III  TM Distance: >3 FB Neck ROM: Full    Dental  (+) Dental Advisory Given, Poor Dentition, Missing   Pulmonary Current Smoker and Patient abstained from smoking.   Pulmonary exam normal breath sounds clear to auscultation       Cardiovascular + CAD, + Past MI and + Cardiac Stents  Normal cardiovascular exam Rhythm:Regular Rate:Normal     Neuro/Psych negative neurological ROS  negative psych ROS   GI/Hepatic negative GI ROS,,,(+)     substance abuse (last use year ago)  cocaine use  Endo/Other  negative endocrine ROS    Renal/GU negative Renal ROS  negative genitourinary   Musculoskeletal negative musculoskeletal ROS (+)    Abdominal   Peds negative pediatric ROS (+)  Hematology negative hematology ROS (+)   Anesthesia Other Findings   Reproductive/Obstetrics negative OB ROS                             Anesthesia Physical Anesthesia Plan  ASA: 3  Anesthesia Plan: General   Post-op Pain Management: Minimal or no pain anticipated   Induction: Intravenous  PONV Risk Score and Plan: Propofol infusion  Airway Management Planned: Nasal Cannula and Natural Airway  Additional Equipment:   Intra-op Plan:   Post-operative Plan:   Informed Consent: I have reviewed the patients History and Physical, chart, labs and discussed the procedure including the risks, benefits and alternatives for the proposed anesthesia with the patient or authorized representative who has indicated his/her understanding and acceptance.     Dental advisory given  Plan Discussed with: CRNA and  Surgeon  Anesthesia Plan Comments:         Anesthesia Quick Evaluation

## 2022-10-28 NOTE — Op Note (Signed)
Patient Name: Christopher [name] Procedure Date: 10/28/2022 7:26 AM MRN: [id number] Date of Birth: 24-Jun-1966 Attending MD: Elon Alas. Abbey Chatters , [place], [id number] CSN: [id number] Age: 56 Admit Type: Outpatient Procedure:                Colonoscopy Indications:              Screening for colorectal malignant neoplasm Providers:                Elon Alas. Abbey Chatters, DO, Tammy Vaught, RN, Everardo Pacific Referring MD:              Medicines:                See the Anesthesia note for documentation of the                            administered medications Complications:            No immediate complications. Estimated Blood Loss:     Estimated blood loss was minimal. Procedure:                Pre-Anesthesia Assessment:                           - The anesthesia plan was to use monitored                            anesthesia care (MAC).                           After obtaining informed consent, the colonoscope                            was passed under direct vision. Throughout the                            procedure, the patient's blood pressure, pulse, and                            oxygen saturations were monitored continuously. The                            [id number]-HQ190L ([id number]) scope was introduced through                            the anus and advanced to the the cecum, identified                            by appendiceal orifice and ileocecal valve. The                            colonoscopy was performed without difficulty. The                            patient tolerated the procedure well. The  quality                            of the bowel preparation was evaluated using the                            BBPS [facility] Bowel Preparation Scale) with scores                            of: Right Colon = 3, Transverse Colon = 3 and Left                            Colon = 3 (entire mucosa seen well with no residual                            staining,  small fragments of stool or opaque                            liquid). The total BBPS score equals 9. Scope In: 7:47:23 AM Scope Out: 8:02:10 AM Scope Withdrawal Time: 0 hours 12 minutes 7 seconds  Total Procedure Duration: 0 hours 14 minutes 47 seconds  Findings:      Hemorrhoids were found on perianal exam.      A few small-mouthed diverticula were found in the sigmoid colon.      Two sessile polyps were found in the ascending colon and ileocecal       valve. The polyps were 4 to 8 mm in size. These polyps were removed with       a cold snare. Resection and retrieval were complete.      A 4 mm polyp was found in the sigmoid colon. The polyp was sessile. The       polyp was removed with a cold snare. Resection and retrieval were       complete.      The exam was otherwise without abnormality. Impression:               - Hemorrhoids found on perianal exam.                           - Diverticulosis in the sigmoid colon.                           - Two 4 to 8 mm polyps in the ascending colon and                            at the ileocecal valve, removed with a cold snare.                            Resected and retrieved.                           - One 4 mm polyp in the sigmoid colon, removed with                            a cold snare. Resected  and retrieved.                           - The examination was otherwise normal. Moderate Sedation:      Per Anesthesia Care Recommendation:           - Patient has a contact number available for                            emergencies. The signs and symptoms of potential                            delayed complications were discussed with the                            patient. Return to normal activities tomorrow.                            Written discharge instructions were provided to the                            patient.                           - Resume previous diet.                           - Continue present medications.                            - Await pathology results.                           - Repeat colonoscopy in 5 years for surveillance.                           - Return to GI clinic in 3 months. Procedure Code(s):        --- Professional ---                           [phone], Colonoscopy, flexible; with removal of                            tumor(s), polyp(s), or other lesion(s) by snare                            technique Diagnosis Code(s):        --- Professional ---                           Z12.11, Encounter for screening for malignant                            neoplasm of colon                           K64.9, Unspecified hemorrhoids  D12.2, Benign neoplasm of ascending colon                           D12.0, Benign neoplasm of cecum                           D12.5, Benign neoplasm of sigmoid colon                           K57.30, Diverticulosis of large intestine without                            perforation or abscess without bleeding CPT copyright 2022 American Medical Association. All rights reserved. The codes documented in this report are preliminary and upon coder review may  be revised to meet current compliance requirements. Elon Alas. Abbey Chatters, DO [place] Abbey Chatters, DO 10/28/2022 8:04:24 AM This report has been signed electronically. Number of Addenda: 0

## 2022-10-28 NOTE — H&P (Signed)
Primary Care Physician:  Pcp, No Primary Gastroenterologist:  Dr. Marletta Lor  Pre-Procedure History & Physical: HPI:  Christopher [name] is a 56 y.o. male is here for an egd for GERD/melena and a colonoscopy to be performed for colon cancer screening purposes.  Past Medical History:  Diagnosis Date   CAD (coronary artery disease)    remote PCI   Cocaine abuse (HCC)    last used 1 year ago   Dyslipidemia    History of kidney stones    NSTEMI (non-ST elevated myocardial infarction) (HCC) 11/19/2017   in setting of cocaine use   Smoker     Past Surgical History:  Procedure Laterality Date   CORONARY ANGIOPLASTY WITH STENT PLACEMENT      Prior to Admission medications   Medication Sig Start Date End Date Taking? Authorizing Provider  aspirin EC 81 MG tablet Take 1 tablet (81 mg total) by mouth daily. Swallow whole. 10/06/22  Yes Maisie Fus, MD  aspirin-acetaminophen-caffeine (EXCEDRIN MIGRAINE) [phone] MG tablet Take 2 tablets by mouth every 6 (six) hours as needed for headache or migraine.   Yes [provider]  ezetimibe (ZETIA) 10 MG tablet Take 1 tablet (10 mg total) by mouth daily. 10/06/22  Yes BranchAlben Spittle, MD  nitroGLYCERIN (NITROSTAT) 0.4 MG SL tablet Place 1 tablet (0.4 mg total) under the tongue every 5 (five) minutes as needed for chest pain. DO NOT TAKE MORE THAN 3 DOSES IN 15 MINUTES 10/06/22 01/04/23  Maisie Fus, MD    Allergies as of 10/11/2022 - Review Complete 10/06/2022  Allergen Reaction Noted   Hydrocodone Itching 07/25/2022   Naproxen Itching 10/28/2011   Lipitor [atorvastatin calcium] Rash 12/15/2017    Family History  Problem Relation Age of Onset   Coronary artery disease Father 75       CABG    Social History   Socioeconomic History   Marital status: Married    Spouse name: Not on file   Number of children: Not on file   Years of education: Not on file   Highest education level: Not on file  Occupational History   Not on file   Tobacco Use   Smoking status: Every Day    Packs/day: 1.00    Years: 30.00    Total pack years: 30.00    Types: Cigarettes   Smokeless tobacco: Never  Vaping Use   Vaping Use: Never used  Substance and Sexual Activity   Alcohol use: No   Drug use: Not Currently    Types: Cocaine    Comment: last used 1 year ago-10/26/2022   Sexual activity: Yes  Other Topics Concern   Not on file  Social History Narrative   Not on file   Social Determinants of Health   Financial Resource Strain: Medium Risk (10/06/2022)   Overall Financial Resource Strain (CARDIA)    Difficulty of Paying Living Expenses: Somewhat hard  Food Insecurity: Not on file  Transportation Needs: Not on file  Physical Activity: Not on file  Stress: Not on file  Social Connections: Not on file  Intimate Partner Violence: Not on file    Review of Systems: See HPI, otherwise negative ROS  Physical Exam: Vital signs in last 24 hours: Temp:  [98.5 F (36.9 C)] 98.5 F (36.9 C) (12/01 0656) Pulse Rate:  [68] 68 (12/01 0656) Resp:  [14] 14 (12/01 0656) BP: (112)/(77) 112/77 (12/01 0656) SpO2:  [97 %] 97 % (12/01 0656) Weight:  [115.7 kg] 115.7 kg (  12/01 0656)   General:   Alert,  Well-developed, well-nourished, pleasant and cooperative in NAD Head:  Normocephalic and atraumatic. Eyes:  Sclera clear, no icterus.   Conjunctiva pink. Ears:  Normal auditory acuity. Nose:  No deformity, discharge,  or lesions. Msk:  Symmetrical without gross deformities. Normal posture. Extremities:  Without clubbing or edema. Neurologic:  Alert and  oriented x4;  grossly normal neurologically. Skin:  Intact without significant lesions or rashes. Psych:  Alert and cooperative. Normal mood and affect.  Impression/Plan: CHASIN FINDLING is here for an egd for GERD/melena and a colonoscopy to be performed for colon cancer screening purposes.  The risks of the procedure including infection, bleed, or perforation as well as benefits,  limitations, alternatives and imponderables have been reviewed with the patient. Questions have been answered. All parties agreeable.

## 2022-10-31 LAB — SURGICAL PATHOLOGY

## 2022-11-04 ENCOUNTER — Encounter (HOSPITAL_COMMUNITY): Payer: Self-pay | Admitting: Internal Medicine

## 2022-11-07 NOTE — H&P (Signed)
[facility] History and Physical   Reason for Referral: Inguinal hernia  Referring Physician: ED Referral    Chief Complaint   New Patient (Initial Visit)        Christopher [name] is a 56 y.o. male.  HPI: Christopher [name] is a 56 yo who has noticed a right inguinal hernia that is getting larger for some time now. He says it has been getting larger and causing him problem. He says he has noticed a burning pain in the groin and some numbness on his leg and some pain in his hip. 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 He has never had the hernia get stuck out of hard.    He sees Dr. Wyline Mood for cardiology and is scheduled for a colonoscopy Friday due to dark stools. His father has colon cancer currently.        Past Medical History:  Diagnosis Date   CAD (coronary artery disease)      remote PCI   Cocaine abuse (HCC)     Dyslipidemia     History of kidney stones     NSTEMI (non-ST elevated myocardial infarction) (HCC) 11/19/2017    in setting of cocaine use   Smoker             Past Surgical History:  Procedure Laterality Date   CORONARY ANGIOPLASTY WITH STENT PLACEMENT               Family History  Problem Relation Age of Onset   Coronary artery disease Father 47        CABG      Social History         Tobacco Use   Smoking status: Every Day      Packs/day: 1.00      Types: Cigarettes   Smokeless tobacco: Never  Substance Use Topics   Alcohol use: No   Drug use: Not Currently      Frequency: 1.0 times per week      Types: Cocaine      Comment: past use      Medications: I have reviewed the patient's current medications. Allergies as of 10/25/2022         Reactions    Hydrocodone Itching    Naproxen Itching    Lipitor [atorvastatin Calcium] Rash    Pt says he had a rash on his arms with Lipitor and stopped it            Medication List            Accurate as of October 25, 2022 10:41 AM. If you have any questions, ask your nurse or doctor.              aspirin EC 81 MG tablet Take 1 tablet (81 mg total) by mouth daily. Swallow whole.    ezetimibe 10 MG tablet Commonly known as: Zetia Take 1 tablet (10 mg total) by mouth daily.    nitroGLYCERIN 0.4 MG SL tablet Commonly known as: NITROSTAT Place 1 tablet (0.4 mg total) under the tongue every 5 (five) minutes as needed for chest pain. DO NOT TAKE MORE THAN 3 DOSES IN 15 MINUTES               ROS:  A comprehensive review of systems was negative except for: Gastrointestinal: positive for abdominal pain and right inguinal  hernia Musculoskeletal: positive for back pain, neck pain, and joint pain   Blood pressure 134/88, pulse 72, temperature 97.7 F (36.5 C), temperature source Oral, resp. rate 14, height 5\' 9"  (1.753 m), weight 255 lb (115.7 kg), SpO2 96 %. Physical Exam Vitals reviewed.  Constitutional:      Appearance: Normal appearance.  HENT:     Head: Normocephalic.     Nose: Nose normal.  Eyes:     Extraocular Movements: Extraocular movements intact.  Cardiovascular:     Rate and Rhythm: Normal rate and regular rhythm.  Pulmonary:     Effort: Pulmonary effort is normal.     Breath sounds: Normal breath sounds.  Abdominal:     General: There is no distension.     Palpations: Abdomen is soft.     Tenderness: There is abdominal tenderness.     Hernia: A hernia is present. Hernia is present in the umbilical area, left inguinal area and right inguinal area.     Comments: Small left hernia, reducible, larger right reducible, small reducible umbilical hernia   Musculoskeletal:        General: Normal range of motion.     Cervical back: Normal range of motion.  Skin:    General: Skin is warm.  Neurological:     General: No focal deficit present.     Mental Status: He is alert and oriented to person, place, and time.  Psychiatric:        Mood and Affect: Mood  normal.        Behavior: Behavior normal.        Thought Content: Thought content normal.        Results: Personally reviewed imaging  right and left inguinal hernia, small umbilical hernia all with fat  CLINICAL DATA:  Abdominal pain, RIGHT lower quadrant pain for 8 months, intermittent rectal bleeding and frequent diarrhea.   EXAM: CT ABDOMEN AND PELVIS WITH CONTRAST   TECHNIQUE: Multidetector CT imaging of the abdomen and pelvis was performed using the standard protocol following bolus administration of intravenous contrast.   RADIATION DOSE REDUCTION: This exam was performed according to the departmental dose-optimization program which includes automated exposure control, adjustment of the mA and/or kV according to patient size and/or use of iterative reconstruction technique.   CONTRAST:  119mL OMNIPAQUE IOHEXOL 300 MG/ML  SOLN   COMPARISON:  Mar 28, 2020   FINDINGS: Lower chest: Mild basilar atelectasis. No effusion. No consolidative changes.   Hepatobiliary: Moderate to marked hepatic steatosis. Fissural widening of hepatic fissures. No signs of portal hypertension. No focal, suspicious hepatic lesion. The portal vein is patent. No pericholecystic stranding or sign of biliary duct distension.   Pancreas: Normal, without mass, inflammation or ductal dilatation.   Spleen: Normal.   Adrenals/Urinary Tract: Adrenal glands are normal.   Symmetric renal enhancement without hydronephrosis or perinephric stranding. No perivesical stranding. No suspicious renal lesion.   Stomach/Bowel: No acute gastric abnormality. No small bowel dilation or signs of small bowel inflammation.   Appendix is normal.   No colonic dilation or inflammation.   Vascular/Lymphatic: No signs of adenopathy in the retroperitoneum or in the upper abdomen.   Mesenteric stranding seen previously and mildly prominent subcentimeter lymph nodes that were seen previously in the root of the small  bowel mesentery have nearly completely resolved. The largest lymph node in the small bowel mesentery is now approximately 3 mm short axis as compared to 8 mm on the prior study.   Signs of  aortic atherosclerosis post calcified and noncalcified. No aortic dilation.   No pelvic sidewall lymphadenopathy.   Reproductive: Unremarkable by CT.   Other: Moderate RIGHT and small LEFT inguinal hernias containing fat. Small fat containing umbilical hernia. No ascites   Musculoskeletal: No acute bone finding. No destructive bone process. Spinal degenerative changes. Degenerative changes are mild.   IMPRESSION: 1. No acute findings in the abdomen or pelvis. 2. Moderate hepatic moderate to marked hepatic steatosis with fissural widening. Correlate with signs of liver disease. 3. Moderate RIGHT and small LEFT inguinal hernias containing fat. 4. Small fat containing umbilical hernia.   Aortic Atherosclerosis (ICD10-I70.0).     Electronically Signed   By: Zetta Bills M.D.   On: 07/25/2022 21:57       Assessment & Plan:  Christopher [name] is a 56 y.o. male with bilateral inguinal hernias and a small umbilical hernia. Discussed robotic assisted inguinal hernia repair on both sides at the same time and a small open repair of the umbilical hernia while I am closing the other port sites. Discussed use of mesh in all hernias.    Discussed the risk and benefits including, bleeding, infection, use of mesh, risk of recurrence, risk of nerve damage causing numbness or changes in sensation, risk of damage to the cord structures. The patient understands the risk and benefits of repair with mesh, and has decided to proceed.  We also discussed open versus robotic assisted laparoscopic surgery and the use of mesh. We discussed that I do both robotic and open repairs with mesh, and that these are considered equivalent. We discussed reasons for opting for laparoscopic surgery including if a bilateral repair is  needed or if a patient has a recurrence after an open repair. We discussed the option of watch and wait in men and discussed that in 5 years some studies report that 40% of men have crossed over to needing a hernia repair because the hernia has become larger or symptomatic. We discussed that women are not appropriate candidate for watchful waiting due to the risk of femoral hernias.    Will wait for his colonoscopy results. Will send Dr. Abbey Chatters and Dr. Harl Bowie a message.    Plan for robotic assisted laparoscopic bilateral inguinal hernia and open repair of umbilical hernia with mesh.       All questions were answered to the satisfaction of the patient.     Virl Cagey 10/25/2022, 10:41 AM

## 2022-11-07 NOTE — Patient Instructions (Signed)
Christopher [name]  11/07/2022     @   Your procedure is scheduled on  11/10/2022.   Report to [facility] at  0800 A.M.   Call this number if you have problems the morning of surgery:  [phone]  If you experience any cold or flu symptoms such as cough, fever, chills, shortness of breath, etc. between now and your scheduled surgery, please notify us at the above number.   Remember:  Do not eat or drink after midnight.      Take these medicines the morning of surgery with A SIP OF WATER                                      pantoprazole.     Do not wear jewelry, make-up or nail polish.  Do not wear lotions, powders, or perfumes, or deodorant.  Do not shave 48 hours prior to surgery.  Men may shave face and neck.  Do not bring valuables to the hospital.  [facility] is not responsible for any belongings or valuables.  Contacts, dentures or bridgework may not be worn into surgery.  Leave your suitcase in the car.  After surgery it may be brought to your room.  For patients admitted to the hospital, discharge time will be determined by your treatment team.  Patients discharged the day of surgery will not be allowed to drive home and must have someone with them for 24 hours.    Special instructions:   DO NOT smoke tobacco or vape for 24 hours before your procedure.  Please read over the following fact sheets that you were given. Coughing and Deep Breathing, Surgical Site Infection Prevention, Anesthesia Post-op Instructions, and Care and Recovery After Surgery      Laparoscopic Inguinal Hernia Repair, Adult, Care After The following information offers guidance on how to care for yourself after your procedure. Your health care provider may also give you more specific instructions. If you have problems or questions, contact your health care provider. What can I expect after the procedure? After the procedure, it is common to  have: Pain. Swelling and bruising around the incision area. Scrotal swelling, in males. Some fluid or blood draining from your incisions. Follow these instructions at home: Medicines Take over-the-counter and prescription medicines only as told by your health care provider. Ask your health care provider if the medicine prescribed to you: Requires you to avoid driving or using machinery. Can cause constipation. You may need to take these actions to prevent or treat constipation: Drink enough fluid to keep your urine pale yellow. Take over-the-counter or prescription medicines. Eat foods that are high in fiber, such as beans, whole grains, and fresh fruits and vegetables. Limit foods that are high in fat and processed sugars, such as fried or sweet foods. Incision care  Follow instructions from your health care provider about how to take care of your incisions. Make sure you: Wash your hands with soap and water for at least 20 seconds before and after you change your bandage (dressing). If soap and water are not available, use hand sanitizer. Change your dressing as told by your health care provider. Leave stitches (sutures), skin glue, or adhesive strips in place. These skin closures may need to stay in place for 2 weeks or longer. If adhesive strip edges start to loosen and curl up,  you may trim the loose edges. Do not remove adhesive strips completely unless your health care provider tells you to do that. Check your incision area every day for signs of infection. Check for: More redness, swelling, or pain. More fluid or blood. Warmth. Pus or a bad smell. Wear loose, soft clothing while your incisions heal. Managing pain and swelling If directed, put ice on the painful or swollen areas. To do this: Put ice in a plastic bag. Place a towel between your skin and the bag. Leave the ice on for 20 minutes, 2-3 times a day. Remove the ice if your skin turns bright red. This is very  important. If you cannot feel pain, heat, or cold, you have a greater risk of damage to the area.  Activity Do not lift anything that is heavier than 10 lb (4.5 kg), or the limit that you are told, until your health care provider says that it is safe. Ask your health care provider what activities are safe for you. A lot of activity during the first week after surgery can increase pain and swelling. For 1 week after your procedure: Avoid activities that take a lot of effort, such as exercise or sports. You may walk and climb stairs as needed for daily activity, but avoid long walks or climbing stairs for exercise. General instructions If you were given a sedative during the procedure, it can affect you for several hours. Do not drive or operate machinery until your health care provider says that it is safe. Do not take baths, swim, or use a hot tub until your health care provider approves. Ask your health care provider if you may take showers. You may only be allowed to take sponge baths. Do not use any products that contain nicotine or tobacco. These products include cigarettes, chewing tobacco, and vaping devices, such as e-cigarettes. If you need help quitting, ask your health care provider. Keep all follow-up visits. This is important. Contact a health care provider if: You have any of these signs of infection: More redness, swelling, or pain around your incisions or your groin area. More fluid or blood coming from an incision. Warmth coming from an incision. Pus or a bad smell coming from an incision. A fever or chills. You have more swelling in your scrotum, if you are male. You have severe pain and medicines do not help. You have abdominal pain or swelling. You cannot urinate or have a bowel movement. You faint or feel dizzy. You have nausea and vomiting. Get help right away if: You have redness, warmth, or pain in your leg. You have chest pain. You have problems breathing. These  symptoms may represent a serious problem that is an emergency. Do not wait to see if the symptoms will go away. Get medical help right away. Call your local emergency services (911 in the [place] Do not drive yourself to the hospital. Summary Pain, swelling, and bruising are common after the procedure. Check your incision area every day for signs of infection, such as more redness, swelling, or pain. Put ice on painful or swollen areas for 20 minutes, 2-3 times a day. This information is not intended to replace advice given to you by your health care provider. Make sure you discuss any questions you have with your health care provider. Document Revised: 07/14/2020 Document Reviewed: 07/14/2020 Elsevier Patient Education  2023 [employer] General Anesthesia, Adult, Care After The following information offers guidance on how to care for yourself after your procedure.  Your health care provider may also give you more specific instructions. If you have problems or questions, contact your health care provider. What can I expect after the procedure? After the procedure, it is common for people to: Have pain or discomfort at the IV site. Have nausea or vomiting. Have a sore throat or hoarseness. Have trouble concentrating. Feel cold or chills. Feel weak, sleepy, or tired (fatigue). Have soreness and body aches. These can affect parts of the body that were not involved in surgery. Follow these instructions at home: For the time period you were told by your health care provider:  Rest. Do not participate in activities where you could fall or become injured. Do not drive or use machinery. Do not drink alcohol. Do not take sleeping pills or medicines that cause drowsiness. Do not make important decisions or sign legal documents. Do not take care of children on your own. General instructions Drink enough fluid to keep your urine pale yellow. If you have sleep apnea, surgery and certain medicines  can increase your risk for breathing problems. Follow instructions from your health care provider about wearing your sleep device: Anytime you are sleeping, including during daytime naps. While taking prescription pain medicines, sleeping medicines, or medicines that make you drowsy. Return to your normal activities as told by your health care provider. Ask your health care provider what activities are safe for you. Take over-the-counter and prescription medicines only as told by your health care provider. Do not use any products that contain nicotine or tobacco. These products include cigarettes, chewing tobacco, and vaping devices, such as e-cigarettes. These can delay incision healing after surgery. If you need help quitting, ask your health care provider. Contact a health care provider if: You have nausea or vomiting that does not get better with medicine. You vomit every time you eat or drink. You have pain that does not get better with medicine. You cannot urinate or have bloody urine. You develop a skin rash. You have a fever. Get help right away if: You have trouble breathing. You have chest pain. You vomit blood. These symptoms may be an emergency. Get help right away. Call 911. Do not wait to see if the symptoms will go away. Do not drive yourself to the hospital. Summary After the procedure, it is common to have a sore throat, hoarseness, nausea, vomiting, or to feel weak, sleepy, or fatigue. For the time period you were told by your health care provider, do not drive or use machinery. Get help right away if you have difficulty breathing, have chest pain, or vomit blood. These symptoms may be an emergency. This information is not intended to replace advice given to you by your health care provider. Make sure you discuss any questions you have with your health care provider. Document Revised: 02/11/2022 Document Reviewed: 02/11/2022 Elsevier Patient Education  2023 [employer] How to Use Chlorhexidine Before Surgery Chlorhexidine gluconate (CHG) is a germ-killing (antiseptic) solution that is used to clean the skin. It can get rid of the bacteria that normally live on the skin and can keep them away for about 24 hours. To clean your skin with CHG, you may be given: A CHG solution to use in the shower or as part of a sponge bath. A prepackaged cloth that contains CHG. Cleaning your skin with CHG may help lower the risk for infection: While you are staying in the intensive care unit of the hospital. If you have a vascular access, such as  a central line, to provide short-term or long-term access to your veins. If you have a catheter to drain urine from your bladder. If you are on a ventilator. A ventilator is a machine that helps you breathe by moving air in and out of your lungs. After surgery. What are the risks? Risks of using CHG include: A skin reaction. Hearing loss, if CHG gets in your ears and you have a perforated eardrum. Eye injury, if CHG gets in your eyes and is not rinsed out. The CHG product catching fire. Make sure that you avoid smoking and flames after applying CHG to your skin. Do not use CHG: If you have a chlorhexidine allergy or have previously reacted to chlorhexidine. On babies younger than 14 months of age. How to use CHG solution Use CHG only as told by your health care provider, and follow the instructions on the label. Use the full amount of CHG as directed. Usually, this is one bottle. During a shower Follow these steps when using CHG solution during a shower (unless your health care provider gives you different instructions): Start the shower. Use your normal soap and shampoo to wash your face and hair. Turn off the shower or move out of the shower stream. Pour the CHG onto a clean washcloth. Do not use any type of brush or rough-edged sponge. Starting at your neck, lather your body down to your toes. Make sure you follow these  instructions: If you will be having surgery, pay special attention to the part of your body where you will be having surgery. Scrub this area for at least 1 minute. Do not use CHG on your head or face. If the solution gets into your ears or eyes, rinse them well with water. Avoid your genital area. Avoid any areas of skin that have broken skin, cuts, or scrapes. Scrub your back and under your arms. Make sure to wash skin folds. Let the lather sit on your skin for 1-2 minutes or as long as told by your health care provider. Thoroughly rinse your entire body in the shower. Make sure that all body creases and crevices are rinsed well. Dry off with a clean towel. Do not put any substances on your body afterward--such as powder, lotion, or perfume--unless you are told to do so by your health care provider. Only use lotions that are recommended by the manufacturer. Put on clean clothes or pajamas. If it is the night before your surgery, sleep in clean sheets.  During a sponge bath Follow these steps when using CHG solution during a sponge bath (unless your health care provider gives you different instructions): Use your normal soap and shampoo to wash your face and hair. Pour the CHG onto a clean washcloth. Starting at your neck, lather your body down to your toes. Make sure you follow these instructions: If you will be having surgery, pay special attention to the part of your body where you will be having surgery. Scrub this area for at least 1 minute. Do not use CHG on your head or face. If the solution gets into your ears or eyes, rinse them well with water. Avoid your genital area. Avoid any areas of skin that have broken skin, cuts, or scrapes. Scrub your back and under your arms. Make sure to wash skin folds. Let the lather sit on your skin for 1-2 minutes or as long as told by your health care provider. Using a different clean, wet washcloth, thoroughly rinse your entire body.  Make sure that  all body creases and crevices are rinsed well. Dry off with a clean towel. Do not put any substances on your body afterward--such as powder, lotion, or perfume--unless you are told to do so by your health care provider. Only use lotions that are recommended by the manufacturer. Put on clean clothes or pajamas. If it is the night before your surgery, sleep in clean sheets. How to use CHG prepackaged cloths Only use CHG cloths as told by your health care provider, and follow the instructions on the label. Use the CHG cloth on clean, dry skin. Do not use the CHG cloth on your head or face unless your health care provider tells you to. When washing with the CHG cloth: Avoid your genital area. Avoid any areas of skin that have broken skin, cuts, or scrapes. Before surgery Follow these steps when using a CHG cloth to clean before surgery (unless your health care provider gives you different instructions): Using the CHG cloth, vigorously scrub the part of your body where you will be having surgery. Scrub using a back-and-forth motion for 3 minutes. The area on your body should be completely wet with CHG when you are done scrubbing. Do not rinse. Discard the cloth and let the area air-dry. Do not put any substances on the area afterward, such as powder, lotion, or perfume. Put on clean clothes or pajamas. If it is the night before your surgery, sleep in clean sheets.  For general bathing Follow these steps when using CHG cloths for general bathing (unless your health care provider gives you different instructions). Use a separate CHG cloth for each area of your body. Make sure you wash between any folds of skin and between your fingers and toes. Wash your body in the following order, switching to a new cloth after each step: The front of your neck, shoulders, and chest. Both of your arms, under your arms, and your hands. Your stomach and groin area, avoiding the genitals. Your right leg and  foot. Your left leg and foot. The back of your neck, your back, and your buttocks. Do not rinse. Discard the cloth and let the area air-dry. Do not put any substances on your body afterward--such as powder, lotion, or perfume--unless you are told to do so by your health care provider. Only use lotions that are recommended by the manufacturer. Put on clean clothes or pajamas. Contact a health care provider if: Your skin gets irritated after scrubbing. You have questions about using your solution or cloth. You swallow any chlorhexidine. Call your local poison control center ([phone] in the [place] Get help right away if: Your eyes itch badly, or they become very red or swollen. Your skin itches badly and is red or swollen. Your hearing changes. You have trouble seeing. You have swelling or tingling in your mouth or throat. You have trouble breathing. These symptoms may represent a serious problem that is an emergency. Do not wait to see if the symptoms will go away. Get medical help right away. Call your local emergency services (911 in the [place] Do not drive yourself to the hospital. Summary Chlorhexidine gluconate (CHG) is a germ-killing (antiseptic) solution that is used to clean the skin. Cleaning your skin with CHG may help to lower your risk for infection. You may be given CHG to use for bathing. It may be in a bottle or in a prepackaged cloth to use on your skin. Carefully follow your health care provider's instructions and the  instructions on the product label. Do not use CHG if you have a chlorhexidine allergy. Contact your health care provider if your skin gets irritated after scrubbing. This information is not intended to replace advice given to you by your health care provider. Make sure you discuss any questions you have with your health care provider. Document Revised: 03/14/2022 Document Reviewed: 01/25/2021 Elsevier Patient Education  2023 [employer].

## 2022-11-08 ENCOUNTER — Encounter (HOSPITAL_COMMUNITY)
Admission: RE | Admit: 2022-11-08 | Discharge: 2022-11-08 | Disposition: A | Discharge status: Home / Self Care | Payer: 59 | Source: Ambulatory Visit | Attending: General Surgery | Admitting: General Surgery

## 2022-11-08 ENCOUNTER — Encounter (HOSPITAL_COMMUNITY): Payer: Self-pay

## 2022-11-08 DIAGNOSIS — Z01818 Encounter for other preprocedural examination: Secondary | ICD-10-CM

## 2022-11-08 DIAGNOSIS — F1410 Cocaine abuse, uncomplicated: Secondary | ICD-10-CM | POA: Diagnosis not present

## 2022-11-08 DIAGNOSIS — Z01812 Encounter for preprocedural laboratory examination: Secondary | ICD-10-CM | POA: Insufficient documentation

## 2022-11-08 HISTORY — DX: Unspecified osteoarthritis, unspecified site: M19.90

## 2022-11-08 LAB — RAPID URINE DRUG SCREEN, HOSP PERFORMED
Amphetamines: NOT DETECTED
Barbiturates: NOT DETECTED
Benzodiazepines: NOT DETECTED
Cocaine: NOT DETECTED
Opiates: NOT DETECTED
Tetrahydrocannabinol: NOT DETECTED

## 2022-11-09 LAB — TYPE AND SCREEN
ABO/RH(D): A POS
Antibody Screen: NEGATIVE

## 2022-11-10 ENCOUNTER — Ambulatory Visit (HOSPITAL_BASED_OUTPATIENT_CLINIC_OR_DEPARTMENT_OTHER): Payer: Medicaid Other | Admitting: Certified Registered"

## 2022-11-10 ENCOUNTER — Ambulatory Visit (HOSPITAL_COMMUNITY)
Admission: RE | Admit: 2022-11-10 | Discharge: 2022-11-10 | Disposition: A | Discharge status: Home / Self Care | Payer: Medicaid Other | Source: Ambulatory Visit | Attending: General Surgery | Admitting: General Surgery

## 2022-11-10 ENCOUNTER — Encounter (HOSPITAL_COMMUNITY): Payer: Self-pay | Admitting: General Surgery

## 2022-11-10 ENCOUNTER — Other Ambulatory Visit: Payer: Self-pay

## 2022-11-10 ENCOUNTER — Ambulatory Visit (HOSPITAL_COMMUNITY): Payer: Medicaid Other | Admitting: Certified Registered"

## 2022-11-10 ENCOUNTER — Encounter (HOSPITAL_COMMUNITY)
Admission: RE | Disposition: A | Discharge status: Home / Self Care | Payer: Self-pay | Source: Ambulatory Visit | Attending: General Surgery

## 2022-11-10 DIAGNOSIS — Z955 Presence of coronary angioplasty implant and graft: Secondary | ICD-10-CM | POA: Insufficient documentation

## 2022-11-10 DIAGNOSIS — K4020 Bilateral inguinal hernia, without obstruction or gangrene, not specified as recurrent: Secondary | ICD-10-CM | POA: Diagnosis present

## 2022-11-10 DIAGNOSIS — Z800 Family history of malignant neoplasm of digestive organs: Secondary | ICD-10-CM | POA: Diagnosis not present

## 2022-11-10 DIAGNOSIS — K429 Umbilical hernia without obstruction or gangrene: Secondary | ICD-10-CM

## 2022-11-10 DIAGNOSIS — I2510 Atherosclerotic heart disease of native coronary artery without angina pectoris: Secondary | ICD-10-CM | POA: Insufficient documentation

## 2022-11-10 DIAGNOSIS — I252 Old myocardial infarction: Secondary | ICD-10-CM

## 2022-11-10 DIAGNOSIS — F17210 Nicotine dependence, cigarettes, uncomplicated: Secondary | ICD-10-CM | POA: Diagnosis not present

## 2022-11-10 DIAGNOSIS — K4090 Unilateral inguinal hernia, without obstruction or gangrene, not specified as recurrent: Secondary | ICD-10-CM

## 2022-11-10 HISTORY — PX: UMBILICAL HERNIA REPAIR: SHX196

## 2022-11-10 HISTORY — PX: INGUINAL HERNIA REPAIR: SHX194

## 2022-11-10 LAB — ABO/RH: ABO/RH(D): A POS

## 2022-11-10 SURGERY — REPAIR, HERNIA, INGUINAL, ADULT
Anesthesia: General | Site: Inguinal | Laterality: Right

## 2022-11-10 MED ORDER — BUPIVACAINE HCL (PF) 0.5 % IJ SOLN
INTRAMUSCULAR | Status: AC
  Filled 2022-11-10: qty 30

## 2022-11-10 MED ORDER — BUPIVACAINE HCL (300 MG DOSE) 3 X 100 MG IL IMPL
DRUG_IMPLANT | Status: DC | PRN
  Administered 2022-11-10: 300 mg

## 2022-11-10 MED ORDER — ONDANSETRON HCL 4 MG/2ML IJ SOLN
INTRAMUSCULAR | Status: DC | PRN
  Administered 2022-11-10: 4 mg via INTRAVENOUS

## 2022-11-10 MED ORDER — DEXAMETHASONE SODIUM PHOSPHATE 10 MG/ML IJ SOLN
INTRAMUSCULAR | Status: DC | PRN
  Administered 2022-11-10: 10 mg via INTRAVENOUS

## 2022-11-10 MED ORDER — FENTANYL CITRATE (PF) 250 MCG/5ML IJ SOLN
INTRAMUSCULAR | Status: AC
  Filled 2022-11-10: qty 5

## 2022-11-10 MED ORDER — FENTANYL CITRATE PF 50 MCG/ML IJ SOSY
25.0000 ug | PREFILLED_SYRINGE | INTRAMUSCULAR | Status: DC | PRN
  Administered 2022-11-10 (×2): 50 ug via INTRAVENOUS
  Filled 2022-11-10 (×2): qty 1

## 2022-11-10 MED ORDER — ONDANSETRON HCL 4 MG PO TABS: 4.0000 mg | ORAL_TABLET | Freq: Three times a day (TID) | ORAL | 1 refills | Status: AC | PRN

## 2022-11-10 MED ORDER — MIDAZOLAM HCL 2 MG/2ML IJ SOLN
INTRAMUSCULAR | Status: AC
  Filled 2022-11-10: qty 2

## 2022-11-10 MED ORDER — MIDAZOLAM HCL 5 MG/5ML IJ SOLN
INTRAMUSCULAR | Status: DC | PRN
  Administered 2022-11-10: 2 mg via INTRAVENOUS

## 2022-11-10 MED ORDER — 0.9 % SODIUM CHLORIDE (POUR BTL) OPTIME
TOPICAL | Status: DC | PRN
  Administered 2022-11-10: 1000 mL

## 2022-11-10 MED ORDER — LACTATED RINGERS IV SOLN: INTRAVENOUS | Status: DC | PRN

## 2022-11-10 MED ORDER — POLYETHYLENE GLYCOL 3350 17 G PO PACK: 17.0000 g | PACK | Freq: Every day | ORAL | 0 refills | Status: DC

## 2022-11-10 MED ORDER — SUGAMMADEX SODIUM 200 MG/2ML IV SOLN
INTRAVENOUS | Status: DC | PRN
  Administered 2022-11-10: 250 mg via INTRAVENOUS

## 2022-11-10 MED ORDER — CHLORHEXIDINE GLUCONATE CLOTH 2 % EX PADS: 6.0000 | MEDICATED_PAD | Freq: Once | CUTANEOUS | Status: DC

## 2022-11-10 MED ORDER — BUPIVACAINE HCL (300 MG DOSE) 3 X 100 MG IL IMPL
DRUG_IMPLANT | Status: AC
  Filled 2022-11-10: qty 100

## 2022-11-10 MED ORDER — BUPIVACAINE HCL (PF) 0.5 % IJ SOLN
INTRAMUSCULAR | Status: DC | PRN
  Administered 2022-11-10: 20 mL

## 2022-11-10 MED ORDER — ROCURONIUM BROMIDE 10 MG/ML (PF) SYRINGE
PREFILLED_SYRINGE | INTRAVENOUS | Status: AC
  Filled 2022-11-10: qty 10

## 2022-11-10 MED ORDER — LIDOCAINE 2% (20 MG/ML) 5 ML SYRINGE
INTRAMUSCULAR | Status: DC | PRN
  Administered 2022-11-10: 100 mg via INTRAVENOUS

## 2022-11-10 MED ORDER — CEFAZOLIN SODIUM-DEXTROSE 2-4 GM/100ML-% IV SOLN
2.0000 g | INTRAVENOUS | Status: AC
  Administered 2022-11-10: 2 g via INTRAVENOUS

## 2022-11-10 MED ORDER — ACETAMINOPHEN 500 MG PO TABS: 1000.0000 mg | ORAL_TABLET | ORAL | Status: AC

## 2022-11-10 MED ORDER — ONDANSETRON HCL 4 MG/2ML IJ SOLN: 4.0000 mg | Freq: Once | INTRAMUSCULAR | Status: DC | PRN

## 2022-11-10 MED ORDER — LIDOCAINE HCL (PF) 2 % IJ SOLN
INTRAMUSCULAR | Status: AC
  Filled 2022-11-10: qty 5

## 2022-11-10 MED ORDER — CHLORHEXIDINE GLUCONATE 0.12 % MT SOLN: 15.0000 mL | Freq: Once | OROMUCOSAL | Status: DC

## 2022-11-10 MED ORDER — ACETAMINOPHEN 500 MG PO TABS
ORAL_TABLET | ORAL | Status: AC
  Administered 2022-11-10: 1000 mg via ORAL
  Filled 2022-11-10: qty 2

## 2022-11-10 MED ORDER — PHENYLEPHRINE 80 MCG/ML (10ML) SYRINGE FOR IV PUSH (FOR BLOOD PRESSURE SUPPORT)
PREFILLED_SYRINGE | INTRAVENOUS | Status: DC | PRN
  Administered 2022-11-10 (×3): 80 ug via INTRAVENOUS

## 2022-11-10 MED ORDER — FENTANYL CITRATE (PF) 100 MCG/2ML IJ SOLN
INTRAMUSCULAR | Status: DC | PRN
  Administered 2022-11-10 (×5): 50 ug via INTRAVENOUS

## 2022-11-10 MED ORDER — BUPIVACAINE LIPOSOME 1.3 % IJ SUSP
INTRAMUSCULAR | Status: AC
  Filled 2022-11-10: qty 20

## 2022-11-10 MED ORDER — ORAL CARE MOUTH RINSE: 15.0000 mL | Freq: Once | OROMUCOSAL | Status: DC

## 2022-11-10 MED ORDER — CEFAZOLIN SODIUM-DEXTROSE 2-4 GM/100ML-% IV SOLN
INTRAVENOUS | Status: AC
  Filled 2022-11-10: qty 100

## 2022-11-10 MED ORDER — ROCURONIUM BROMIDE 100 MG/10ML IV SOLN
INTRAVENOUS | Status: DC | PRN
  Administered 2022-11-10: 60 mg via INTRAVENOUS
  Administered 2022-11-10: 10 mg via INTRAVENOUS

## 2022-11-10 MED ORDER — SUCCINYLCHOLINE CHLORIDE 200 MG/10ML IV SOSY
PREFILLED_SYRINGE | INTRAVENOUS | Status: DC | PRN
  Administered 2022-11-10: 120 mg via INTRAVENOUS

## 2022-11-10 MED ORDER — PROPOFOL 10 MG/ML IV BOLUS
INTRAVENOUS | Status: DC | PRN
  Administered 2022-11-10: 50 mg via INTRAVENOUS
  Administered 2022-11-10: 200 mg via INTRAVENOUS

## 2022-11-10 MED ORDER — LACTATED RINGERS IV SOLN: INTRAVENOUS | Status: DC

## 2022-11-10 MED ORDER — OXYCODONE HCL 5 MG PO TABS: 5.0000 mg | ORAL_TABLET | ORAL | 0 refills | Status: DC | PRN

## 2022-11-10 MED ORDER — PROPOFOL 10 MG/ML IV BOLUS
INTRAVENOUS | Status: AC
  Filled 2022-11-10: qty 20

## 2022-11-10 SURGICAL SUPPLY — 32 items
ADH SKN CLS APL DERMABOND .7 (GAUZE/BANDAGES/DRESSINGS) ×6
COVER MAYO STAND XLG (MISCELLANEOUS) ×3 IMPLANT
COVER SURGICAL LIGHT HANDLE (MISCELLANEOUS) ×3 IMPLANT
DERMABOND ADVANCED .7 DNX12 (GAUZE/BANDAGES/DRESSINGS) ×4 IMPLANT
DRAPE UTILITY W/TAPE 26X15 (DRAPES) ×1 IMPLANT
DRSG TEGADERM 2-3/8X2-3/4 SM (GAUZE/BANDAGES/DRESSINGS) ×1 IMPLANT
ELECT REM PT RETURN 9FT ADLT (ELECTROSURGICAL) ×3
ELECTRODE REM PT RTRN 9FT ADLT (ELECTROSURGICAL) ×3 IMPLANT
GAUZE SPONGE 4X4 12PLY STRL (GAUZE/BANDAGES/DRESSINGS) ×3 IMPLANT
GLOVE BIO SURGEON STRL SZ 6.5 (GLOVE) ×7 IMPLANT
GLOVE BIO SURGEON STRL SZ7 (GLOVE) ×2 IMPLANT
GLOVE BIOGEL PI IND STRL 6.5 (GLOVE) ×6 IMPLANT
GLOVE BIOGEL PI IND STRL 7.0 (GLOVE) ×7 IMPLANT
GLOVE BIOGEL PI IND STRL 7.5 (GLOVE) ×1 IMPLANT
GOWN STRL REUS W/TWL LRG LVL3 (GOWN DISPOSABLE) ×8 IMPLANT
KIT TURNOVER KIT A (KITS) ×3 IMPLANT
MANIFOLD NEPTUNE II (INSTRUMENTS) ×3 IMPLANT
MESH HERNIA 1.6X1.9 PLUG LRG (Mesh General) ×1 IMPLANT
NDL HYPO 25X1 1.5 SAFETY (NEEDLE) IMPLANT
NEEDLE HYPO 25X1 1.5 SAFETY (NEEDLE) ×3 IMPLANT
PACK LAP CHOLECYSTECTOMY (MISCELLANEOUS) ×3 IMPLANT
SOL PREP POV-IOD 4OZ 10% (MISCELLANEOUS) ×3 IMPLANT
SPONGE GAUZE 2X2 8PLY STRL LF (GAUZE/BANDAGES/DRESSINGS) ×1 IMPLANT
SUT ETHIBOND NAB MO 7 #0 18IN (SUTURE) ×1 IMPLANT
SUT MNCRL AB 4-0 PS2 18 (SUTURE) ×4 IMPLANT
SUT NOVA NAB GS-22 2 2-0 T-19 (SUTURE) ×3 IMPLANT
SUT VIC AB 2-0 CT1 27 (SUTURE) ×3
SUT VIC AB 2-0 CT1 TAPERPNT 27 (SUTURE) ×1 IMPLANT
SUT VIC AB 3-0 SH 27 (SUTURE) ×12
SUT VIC AB 3-0 SH 27X BRD (SUTURE) ×4 IMPLANT
SUT VICRYL AB 3 0 TIES (SUTURE) ×1 IMPLANT
SYR CONTROL 10ML LL (SYRINGE) ×1 IMPLANT

## 2022-11-10 NOTE — Anesthesia Preprocedure Evaluation (Addendum)
Anesthesia Evaluation  Patient identified by MRN, date of birth, ID band Patient awake    Reviewed: Allergy & Precautions, H&P , NPO status , Patient's Chart, lab work & pertinent test results  History of Anesthesia Complications Negative for: history of anesthetic complications  Airway Mallampati: III  TM Distance: >3 FB Neck ROM: Full    Dental  (+) Dental Advisory Given, Poor Dentition, Missing   Pulmonary Current Smoker and Patient abstained from smoking.   Pulmonary exam normal breath sounds clear to auscultation       Cardiovascular Exercise Tolerance: Good + CAD, + Past MI and + Cardiac Stents  Normal cardiovascular exam Rhythm:Regular Rate:Normal  Neg stress test 2019   Neuro/Psych negative neurological ROS  negative psych ROS   GI/Hepatic negative GI ROS,,,(+)     substance abuse (last use year ago)  cocaine use  Endo/Other  negative endocrine ROS    Renal/GU negative Renal ROS  negative genitourinary   Musculoskeletal  (+) Arthritis ,    Abdominal   Peds negative pediatric ROS (+)  Hematology negative hematology ROS (+)   Anesthesia Other Findings   Reproductive/Obstetrics negative OB ROS                              Anesthesia Physical Anesthesia Plan  ASA: 3  Anesthesia Plan: General   Post-op Pain Management:    Induction: Intravenous  PONV Risk Score and Plan: 1 and Dexamethasone, Ondansetron and Midazolam  Airway Management Planned: Oral ETT  Additional Equipment:   Intra-op Plan:   Post-operative Plan: Extubation in OR  Informed Consent: I have reviewed the patients History and Physical, chart, labs and discussed the procedure including the risks, benefits and alternatives for the proposed anesthesia with the patient or authorized representative who has indicated his/her understanding and acceptance.       Plan Discussed with: CRNA  Anesthesia  Plan Comments:          Anesthesia Quick Evaluation

## 2022-11-10 NOTE — Transfer of Care (Signed)
Immediate Anesthesia Transfer of Care Note  Patient: Christopher [name]  Procedure(s) Performed: OPEN HERNIA REPAIR INGUINAL ADULT WITH MESH (Right: Inguinal) HERNIA REPAIR UMBILICAL ADULT (Abdomen)  Patient Location: PACU  Anesthesia Type:General  Level of Consciousness: drowsy and patient cooperative  Airway & Oxygen Therapy: Patient Spontanous Breathing and Patient connected to nasal cannula oxygen  Post-op Assessment: Report given to RN and Post -op Vital signs reviewed and stable  Post vital signs: Reviewed and stable  Last Vitals:  Vitals Value Taken Time  BP 117/77 11/10/22 1227  Temp    Pulse 73 11/10/22 1229  Resp 19 11/10/22 1229  SpO2 91 % 11/10/22 1229  Vitals shown include unvalidated device data.  Last Pain:  Vitals:   11/10/22 0851  TempSrc: Oral  PainSc:          Complications: No notable events documented.

## 2022-11-10 NOTE — Interval H&P Note (Signed)
History and Physical Interval Note:  11/10/2022 9:43 AM  Christopher [name]  has presented today for surgery, with the diagnosis of BILATERAL INGUINAL HERNIA.  The various methods of treatment have been discussed with the patient and family. After consideration of risks, benefits and other options for treatment, the patient has consented to  Procedure(s): XI ROBOTIC ASSISTED INGUINAL HERNIA REPAIR WITH MESH (Bilateral) as a surgical intervention.  The patient's history has been reviewed, patient examined, no change in status, stable for surgery.  I have reviewed the patient's chart and labs.  Questions were answered to the patient's satisfaction.    Robot is not working today. The da vinci team is coming to repair. Patient wants to proceed with a right open inguinal hernia repair and an open umbilical hernia repair both with mesh.  Lucretia Roers

## 2022-11-10 NOTE — Progress Notes (Signed)
[facility]  Updated family. Will need to urinate before dc home. Ice to areas now. Needs to start miralax as was already constipated per the wife.  Algis Greenhouse, MD [facility] [address] [place], [place] [address] [phone] (office)

## 2022-11-10 NOTE — Discharge Instructions (Signed)
Discharge Instructions Hernia:  Your wife said you were already constipated. Plan to take miralax daily for the next few days until you are having bowel movements.  The belly button hernia was closed with permanent suture as it was not large enough for me to close with a mesh (was  1cm in size).  Common Complaints: Pain at the incision site is common. This will improve with time. Take your pain medications as described below. Some nausea is common and poor appetite. The main goal is to stay hydrated the first few days after surgery.  Numbness at the incision or the thigh is common.  If you start to have burning or tingling pain in your groin, this is from a nerve being pinched. Please call and we can prescribe you a different type of pain medication for nerve pain.  Bruising in the side of the repair and down into the scrotum on that side is normal, if the bruising is spreading, call the office.  Some swelling is normal, place a towel under your scrotum (folded into a square) to help with elevation of the scrotum and swelling. Ice the side for the first 72 hours.   Belly Button Repair/ Umbilical Hernia Repair Remove the small clear dressing and gauze after two days (48 hours). Trim the gauze off the glue that is underneath if the gauze is stuck to the glue.  Shower per your regular routine daily.  Do not take hot showers. Take warm showers that are less than 10 minutes.  Rest and listen to your body, but do not remain in bed all day.Walk everyday for at least 15-20 minutes.  Deep cough and move around every 1-2 hours in the first few days after surgery. Do not pick at the dermabond glue on your incision sites.  This glue film will remain in place for 1-2 weeks and will start to peel off. Do not place lotions or balms on your incision unless instructed to specifically by Dr. Henreitta Leber. Do not lift > 10 lbs, perform excessive bending, pushing, pulling, squatting for 6-8 weeks after  surgery. Where your abdominal binder with activity as much as possible. The activity restrictions and the abdominal binder are to prevent hernia formation at your incision while you are healing.   Pain Expectations and Narcotics: -After surgery you will have pain associated with your incisions and this is normal. The pain is muscular and nerve pain, and will get better with time. -You are encouraged and expected to take non narcotic medications like tylenol and ibuprofen (when able) to treat pain as multiple modalities can aid with pain treatment. -Narcotics are only used when pain is severe or there is breakthrough pain. -You are not expected to have a pain score of 0 after surgery, as we cannot prevent pain. A pain score of 3-4 that allows you to be functional, move, walk, and tolerate some activity is the goal. The pain will continue to improve over the days after surgery and is dependent on your surgery. -Due to [place] law, we are only able to give a certain amount of pain medication to treat post operative pain, and we only give additional narcotics on a patient by patient basis.  -For most laparoscopic surgery, studies have shown that the majority of patients only need 10-15 narcotic pills, and for open surgeries most patients only need 15-20.   -Having appropriate expectations of pain and knowledge of pain management with non narcotics is important as we do not want anyone to  become addicted to narcotic pain medication.  -Using ice packs in the first 48 hours and heating pads after 48 hours, wearing an abdominal binder (when recommended), and using over the counter medications are all ways to help with pain management.   -Simple acts like meditation and mindfulness practices after surgery can also help with pain control and research has proven the benefit of these practices.  Medication: Take tylenol and ibuprofen as needed for pain control, alternating every 4-6 hours.  Example:  Tylenol 1000mg   @ 6am, 12noon, 6pm, (Do not exceed 4000mg  of tylenol a day). Ibuprofen 800mg  @ 9am, 3pm, 9pm, 3am (Do not exceed 3600mg  of ibuprofen a day).  Take Roxicodone for breakthrough pain every 4 hours.  Take Colace for constipation related to narcotic pain medication. If you do not have a bowel movement in 2 days, take Miralax over the counter.  Drink plenty of water to also prevent constipation.   Contact Information: If you have questions or concerns, please call our office, [phone], Monday- Thursday 8AM-5PM and Friday 8AM-12Noon.  If it is after hours or on the weekend, please call [facility], [phone], [phone], and ask to speak to the surgeon on call for Dr. Sunday at [facility].

## 2022-11-10 NOTE — Op Note (Signed)
[facility] Operative Note  11/10/22  Preoperative Diagnosis: Right Inguinal hernia, umbilical hernia    Postoperative Diagnosis: Same   Procedure(s) Performed: Open repair right inguinal hernia with mesh, open primary repair of umbilical hernia (1cm defect)    Surgeon: Leatrice Jewels. Henreitta Leber, MD   Assistants: No qualified resident was available    Anesthesia: General endotracheal   Anesthesiologist: Dr. Larence Penning    Specimens: None  Procedure: The patient was taken to the operating room and placed supine. General endotracheal anesthesia was induced. Intravenous antibiotics were administered per protocol.  A time out was preformed verifying the correct patient, procedure, site, positioning and implants.  The abdomen and right groin and scrotum were prepared and draped in the usual sterile fashion.   An incision was made in a natural skin crease between the pubic tubercle and the anterior superior iliac spine.  The incision was deepened with electrocautery through Scarpa's and Camper's fascia until the aponeurosis of the external oblique was encountered.  This was cleaned and the external ring was exposed.  An incision was made in the midportion of the external oblique aponeurosis in the direction of its fibers. The ilioinguinal nerve was identified and was protected throughout the dissection.  Flaps of the external oblique were developed cephalad and inferiorly.    The cord was identified and it was gently dissented free at the pubic tubercle and encircled with a Penrose drain.  Attention was then directed at the anteromedial aspect of the cord, where an indirect hernia sac was identified.  The sac was carefully dissected free from the cord down to the level of the internal ring.  The vas and testicular vessels were identified and protected from harm.  Once the sac was dissected free from the cords, the Penrose was placed around the cord which was retracted inferiorly out of the  field of view.  The hernia was reduced into the internal ring without difficulty.  A Perfix Plug was placed into the defect and filled the space.  Attention was then turned to the floor of the canal, which was grossly weakened and I dissected down to an area of fat that I felt like was a hernia, I opted it up but could not find any poin that was intraperitoneal, so I closed the layer back with 3-0 Vicryl interrupted to make sure the floor was closed. The Perfix Mesh Patch was sutured to the inguinal ligament inferiorly starting at the pubic tubercle using 2-0 Novafil interrupted sutures.  The mesh was sutured superiorly to the conjoint tendon using 2-0 Novafil interrupted sutures.  Care was taken to ensure the mesh was placed in a relaxed fashion to avoid excessive tension and no neurovascular structures were caught in the repair.  Laterally the tails of the mesh were crossed and the internal ring was recreated, allowing for passage of cords without tension. Eldridge Abrahams was layered through the repair.   Hemostasis was adequate.  The Penrose was removed.  The external oblique aponeurosis was closed with a 2-0 Vicryl suture in a running fashion, taking care to not catch the ilioinguinal nerve in the suture line.  Scarpa's fashion was closed with 3-0 Vicryl interrupted sutures. The skin was closed with a subcuticular 4-0 Monocryl suture.  This was covered with a towel.  The umbilical hernia was noted to be reducible and measured about 1cm. An incision was made under the umbilicus and carried down through the subcutaneous tissue with electrocautery.  Dissection was performed down to the level of  the fascia, exposing the hernia sac.  The hernia sac was opened with care, and excess hernia sac was resected with electrocautery.  A finger was ran on the underlying area and I was preperitoneal. I did not have enough room for anything more than my 1st finger, and I would not be able to place any mesh I closed  the defect with  a pants over vest interrupted fashion using 0 Ethibond sutures.  The umbilicus was tacked to the fascia with a 3-0 Vicryl suture.   Hemostasis was confirmed. Bupivacaine was injected into the area. The skin was closed with a running 4-0 Monocryl suture and dermabond was applied to both incisions.  After the dermabond dried a 2X2 and tegaderm were placed over the umbilicus to act as a pressure dressing.    The testis was gently pulled down into its anatomic position in the scrotum.  The patient tolerated the procedure well and was taken to the PACU in stable condition. All counts were correct at the end of the case.       Algis Greenhouse, MD [facility] [address] [place], [place] [address] [phone] (office)

## 2022-11-10 NOTE — Anesthesia Postprocedure Evaluation (Signed)
Anesthesia Post Note  Patient: Christopher [name]  Procedure(s) Performed: OPEN HERNIA REPAIR INGUINAL ADULT WITH MESH (Right: Inguinal) HERNIA REPAIR UMBILICAL ADULT (Abdomen)  Patient location during evaluation: PACU Anesthesia Type: General Level of consciousness: awake and alert Pain management: pain level controlled Vital Signs Assessment: post-procedure vital signs reviewed and stable Respiratory status: spontaneous breathing, nonlabored ventilation, respiratory function stable and patient connected to nasal cannula oxygen Cardiovascular status: blood pressure returned to baseline and stable Postop Assessment: no apparent nausea or vomiting Anesthetic complications: no   There were no known notable events for this encounter.   Last Vitals:  Vitals:   11/10/22 1315 11/10/22 1330  BP: 119/80 113/80  Pulse: 76 80  Resp: 14 18  Temp:    SpO2: 93% 92%    Last Pain:  Vitals:   11/10/22 1315  TempSrc:   PainSc: 8                  Glynis Smiles

## 2022-11-10 NOTE — Anesthesia Procedure Notes (Signed)
Procedure Name: Intubation Date/Time: 11/10/2022 10:31 AM  Performed by: Marny Lowenstein, CRNAPre-anesthesia Checklist: Patient identified, Emergency Drugs available, Suction available and Patient being monitored Patient Re-evaluated:Patient Re-evaluated prior to induction Oxygen Delivery Method: Circle system utilized Preoxygenation: Pre-oxygenation with 100% oxygen Induction Type: IV induction Ventilation: Mask ventilation with difficulty and Oral airway inserted - appropriate to patient size Laryngoscope Size: Hyacinth Meeker and 2 Grade View: Grade I Tube type: Oral Tube size: 7.5 mm Number of attempts: 1 Airway Equipment and Method: Patient positioned with wedge pillow and Stylet Placement Confirmation: ETT inserted through vocal cords under direct vision, positive ETCO2 and breath sounds checked- equal and bilateral Secured at: 23 cm Tube secured with: Tape Dental Injury: Teeth and Oropharynx as per pre-operative assessment  Comments: Difficult to ventilate d/t facial hair and large tongue. Better TV with placement of oral airway. Grade I view with DL.

## 2022-11-11 NOTE — Progress Notes (Signed)
Post-op call completed.  Pt stated last night noted small amount of bleeding from an incision after coughing while bending to sit in chair.  States no further bleeding or concerns.  Advised pt to monitor and notify Dr. Henreitta Leber for s/s infection or bleeding at site.  Advised he could use pillow for splinting when coughing.  Pt verbalized understanding.

## 2022-11-12 ENCOUNTER — Emergency Department (HOSPITAL_COMMUNITY)
Admission: EM | Admit: 2022-11-12 | Discharge: 2022-11-13 | Disposition: A | Discharge status: Home / Self Care | Payer: Medicaid Other | Attending: Emergency Medicine | Admitting: Emergency Medicine

## 2022-11-12 ENCOUNTER — Emergency Department (HOSPITAL_COMMUNITY): Payer: Medicaid Other

## 2022-11-12 ENCOUNTER — Encounter (HOSPITAL_COMMUNITY): Payer: Self-pay

## 2022-11-12 ENCOUNTER — Other Ambulatory Visit: Payer: Self-pay

## 2022-11-12 DIAGNOSIS — S3022XA Contusion of scrotum and testes, initial encounter: Secondary | ICD-10-CM | POA: Insufficient documentation

## 2022-11-12 DIAGNOSIS — Z7982 Long term (current) use of aspirin: Secondary | ICD-10-CM | POA: Diagnosis not present

## 2022-11-12 DIAGNOSIS — K5903 Drug induced constipation: Secondary | ICD-10-CM | POA: Insufficient documentation

## 2022-11-12 DIAGNOSIS — G8918 Other acute postprocedural pain: Secondary | ICD-10-CM | POA: Diagnosis not present

## 2022-11-12 DIAGNOSIS — R52 Pain, unspecified: Secondary | ICD-10-CM | POA: Diagnosis not present

## 2022-11-12 DIAGNOSIS — J9811 Atelectasis: Secondary | ICD-10-CM | POA: Diagnosis not present

## 2022-11-12 DIAGNOSIS — X58XXXA Exposure to other specified factors, initial encounter: Secondary | ICD-10-CM | POA: Diagnosis not present

## 2022-11-12 DIAGNOSIS — Z743 Need for continuous supervision: Secondary | ICD-10-CM | POA: Diagnosis not present

## 2022-11-12 DIAGNOSIS — M7989 Other specified soft tissue disorders: Secondary | ICD-10-CM | POA: Diagnosis not present

## 2022-11-12 DIAGNOSIS — K5900 Constipation, unspecified: Secondary | ICD-10-CM | POA: Diagnosis not present

## 2022-11-12 DIAGNOSIS — Z87438 Personal history of other diseases of male genital organs: Secondary | ICD-10-CM | POA: Diagnosis not present

## 2022-11-12 DIAGNOSIS — I2510 Atherosclerotic heart disease of native coronary artery without angina pectoris: Secondary | ICD-10-CM | POA: Insufficient documentation

## 2022-11-12 DIAGNOSIS — R609 Edema, unspecified: Secondary | ICD-10-CM | POA: Diagnosis not present

## 2022-11-12 LAB — CBC WITH DIFFERENTIAL/PLATELET
Abs Immature Granulocytes: 0.04 10*3/uL (ref 0.00–0.07)
Basophils Absolute: 0.1 10*3/uL (ref 0.0–0.1)
Basophils Relative: 1 %
Eosinophils Absolute: 0.2 10*3/uL (ref 0.0–0.5)
Eosinophils Relative: 2 %
HCT: 47.1 % (ref 39.0–52.0)
Hemoglobin: 15.8 g/dL (ref 13.0–17.0)
Immature Granulocytes: 0 %
Lymphocytes Relative: 31 %
Lymphs Abs: 3.1 10*3/uL (ref 0.7–4.0)
MCH: 30.8 pg (ref 26.0–34.0)
MCHC: 33.5 g/dL (ref 30.0–36.0)
MCV: 91.8 fL (ref 80.0–100.0)
Monocytes Absolute: 1.3 10*3/uL — ABNORMAL HIGH (ref 0.1–1.0)
Monocytes Relative: 13 %
Neutro Abs: 5.3 10*3/uL (ref 1.7–7.7)
Neutrophils Relative %: 53 %
Platelets: 226 10*3/uL (ref 150–400)
RBC: 5.13 MIL/uL (ref 4.22–5.81)
RDW: 13.4 % (ref 11.5–15.5)
WBC: 9.9 10*3/uL (ref 4.0–10.5)
nRBC: 0 % (ref 0.0–0.2)

## 2022-11-12 LAB — COMPREHENSIVE METABOLIC PANEL WITH GFR
ALT: 28 U/L (ref 0–44)
AST: 17 U/L (ref 15–41)
Albumin: 3.5 g/dL (ref 3.5–5.0)
Alkaline Phosphatase: 45 U/L (ref 38–126)
Anion gap: 7 (ref 5–15)
BUN: 14 mg/dL (ref 6–20)
CO2: 26 mmol/L (ref 22–32)
Calcium: 8.3 mg/dL — ABNORMAL LOW (ref 8.9–10.3)
Chloride: 104 mmol/L (ref 98–111)
Creatinine, Ser: 1.06 mg/dL (ref 0.61–1.24)
GFR, Estimated: >60 mL/min
Glucose, Bld: 99 mg/dL (ref 70–99)
Potassium: 3.9 mmol/L (ref 3.5–5.1)
Sodium: 137 mmol/L (ref 135–145)
Total Bilirubin: 0.5 mg/dL (ref 0.3–1.2)
Total Protein: 6.2 g/dL — ABNORMAL LOW (ref 6.5–8.1)

## 2022-11-12 LAB — URINALYSIS, ROUTINE W REFLEX MICROSCOPIC
Bilirubin Urine: NEGATIVE
Glucose, UA: NEGATIVE mg/dL
Hgb urine dipstick: NEGATIVE
Ketones, ur: NEGATIVE mg/dL
Leukocytes,Ua: NEGATIVE
Nitrite: NEGATIVE
Protein, ur: NEGATIVE mg/dL
Specific Gravity, Urine: 1.018 (ref 1.005–1.030)
pH: 5 (ref 5.0–8.0)

## 2022-11-12 MED ORDER — HYDROMORPHONE HCL 1 MG/ML IJ SOLN
1.0000 mg | Freq: Once | INTRAMUSCULAR | Status: AC
  Administered 2022-11-12: 1 mg via INTRAVENOUS
  Filled 2022-11-12: qty 1

## 2022-11-12 MED ORDER — SODIUM CHLORIDE 0.9 % IV BOLUS
1000.0000 mL | Freq: Once | INTRAVENOUS | Status: AC
  Administered 2022-11-12: 1000 mL via INTRAVENOUS

## 2022-11-12 MED ORDER — SORBITOL 70 % SOLN
960.0000 mL | TOPICAL_OIL | Freq: Once | ORAL | Status: AC
  Administered 2022-11-12: 960 mL via RECTAL
  Filled 2022-11-12: qty 240

## 2022-11-12 MED ORDER — ONDANSETRON HCL 4 MG/2ML IJ SOLN
4.0000 mg | Freq: Once | INTRAMUSCULAR | Status: AC
  Administered 2022-11-12: 4 mg via INTRAVENOUS
  Filled 2022-11-12: qty 2

## 2022-11-12 MED ORDER — SENNOSIDES-DOCUSATE SODIUM 8.6-50 MG PO TABS: 1.0000 | ORAL_TABLET | Freq: Every evening | ORAL | 0 refills | Status: DC | PRN

## 2022-11-12 MED ORDER — MORPHINE SULFATE (PF) 4 MG/ML IV SOLN
4.0000 mg | Freq: Once | INTRAVENOUS | Status: AC
  Administered 2022-11-12: 4 mg via INTRAVENOUS
  Filled 2022-11-12: qty 1

## 2022-11-12 NOTE — ED Notes (Signed)
Gave PT urinal

## 2022-11-12 NOTE — ED Notes (Signed)
Patient transported to X-ray 

## 2022-11-12 NOTE — ED Triage Notes (Signed)
Pt via [facility] from home c/o of pain after hernia surgery x2 ago. Pt states that his testicles are swollen and that it started yesterday. Pt also endorses no BM in a few days.

## 2022-11-12 NOTE — ED Notes (Signed)
AC called to bring smog enema

## 2022-11-12 NOTE — ED Provider Notes (Signed)
[facility] EMERGENCY DEPARTMENT Provider Note   CSN: [id number] Arrival date & time: 11/12/22  2037     History  Chief Complaint  Patient presents with   Post-op Problem    Christopher [name] is a 56 y.o. male.  Pt is a 56 yo male with a pmhx significant for CAD, HLD, and kidney stones.  He had an open repair of his right inguinal hernia with mesh and open repair of umbilical hernia on [id number].  Pt was d/c after repair.  He has a lot of pain in his groin and has swelling in his testicles.  Pt also has not had a bowel movement in over a week.  He did not call Dr. Constance Haw to let her know this was happening.          Home Medications Prior to Admission medications   Medication Sig Start Date End Date Taking? Authorizing Provider  senna-docusate (SENOKOT-S) 8.6-50 MG tablet Take 1 tablet by mouth at bedtime as needed for mild constipation. 11/12/22  Yes Isla Pence, MD  aspirin EC 81 MG tablet Take 1 tablet (81 mg total) by mouth daily. Swallow whole. 10/06/22   Janina Mayo, MD  aspirin-acetaminophen-caffeine (EXCEDRIN MIGRAINE) [phone] MG tablet Take 2 tablets by mouth every 6 (six) hours as needed for headache or migraine.    [provider]  ezetimibe (ZETIA) 10 MG tablet Take 1 tablet (10 mg total) by mouth daily. 10/06/22   Janina Mayo, MD  nitroGLYCERIN (NITROSTAT) 0.4 MG SL tablet Place 1 tablet (0.4 mg total) under the tongue every 5 (five) minutes as needed for chest pain. DO NOT TAKE MORE THAN 3 DOSES IN 15 MINUTES 10/06/22 01/04/23  Janina Mayo, MD  ondansetron (ZOFRAN) 4 MG tablet Take 1 tablet (4 mg total) by mouth every 8 (eight) hours as needed. 11/10/22 11/10/23  Virl Cagey, MD  oxyCODONE (ROXICODONE) 5 MG immediate release tablet Take 1 tablet (5 mg total) by mouth every 4 (four) hours as needed for severe pain or breakthrough pain. 11/10/22 11/10/23  Virl Cagey, MD  pantoprazole (PROTONIX) 40 MG tablet Take 1 tablet (40 mg total) by  mouth 2 (two) times daily. 10/28/22 10/28/23  Eloise Harman, DO  polyethylene glycol (MIRALAX) 17 g packet Take 17 g by mouth daily. 11/10/22   Virl Cagey, MD      Allergies    Hydrocodone, Naproxen, and Lipitor [atorvastatin calcium]    Review of Systems   Review of Systems  Gastrointestinal:  Positive for abdominal pain and constipation.  All other systems reviewed and are negative.   Physical Exam Updated Vital Signs BP 129/73   Pulse 71   Temp 98.7 F (37.1 C) (Oral)   Resp 18   Ht 5\' 9"  (1.753 m)   Wt 113.4 kg   SpO2 95%   BMI 36.92 kg/m  Physical Exam Vitals and nursing note reviewed.  Constitutional:      Appearance: Normal appearance.  HENT:     Head: Normocephalic and atraumatic.     Right Ear: External ear normal.     Left Ear: External ear normal.     Nose: Nose normal.     Mouth/Throat:     Mouth: Mucous membranes are moist.     Pharynx: Oropharynx is clear.  Eyes:     Extraocular Movements: Extraocular movements intact.     Conjunctiva/sclera: Conjunctivae normal.     Pupils: Pupils are equal, round, and reactive to light.  Cardiovascular:     Rate and Rhythm: Normal rate and regular rhythm.     Pulses: Normal pulses.     Heart sounds: Normal heart sounds.  Pulmonary:     Effort: Pulmonary effort is normal.     Breath sounds: Normal breath sounds.  Abdominal:     General: Abdomen is flat. Bowel sounds are decreased.     Comments: Surgical sites look good  Genitourinary:    Comments: Bruising to scrotum with some mild swelling Musculoskeletal:        General: Normal range of motion.     Cervical back: Normal range of motion and neck supple.  Skin:    General: Skin is warm.     Capillary Refill: Capillary refill takes less than 2 seconds.  Neurological:     General: No focal deficit present.     Mental Status: He is alert and oriented to person, place, and time.  Psychiatric:        Mood and Affect: Mood normal.        Behavior:  Behavior normal.     ED Results / Procedures / Treatments   Labs (all labs ordered are listed, but only abnormal results are displayed) Labs Reviewed  COMPREHENSIVE METABOLIC PANEL - Abnormal; Notable for the following components:      Result Value   Calcium 8.3 (*)    Total Protein 6.2 (*)    All other components within normal limits  CBC WITH DIFFERENTIAL/PLATELET - Abnormal; Notable for the following components:   Monocytes Absolute 1.3 (*)    All other components within normal limits  URINALYSIS, ROUTINE W REFLEX MICROSCOPIC    EKG None  Radiology DG Abdomen Acute W/Chest  Result Date: 11/12/2022 CLINICAL DATA:  Constipation swollen testicles EXAM: DG ABDOMEN ACUTE WITH 1 VIEW CHEST COMPARISON:  11/07/2021, CT 07/25/2022 FINDINGS: Single view chest demonstrates streaky bibasilar atelectasis. No consolidation or effusion. Stable cardiomediastinal silhouette. Supine and upright views of the abdomen demonstrate no free air beneath the diaphragm. Nonobstructed gas pattern with moderate stool burden. No radiopaque calculi. IMPRESSION: No active disease in the chest. Nonobstructed gas pattern with moderate stool burden. Electronically Signed   By: Donavan Foil M.D.   On: 11/12/2022 21:46    Procedures Procedures    Medications Ordered in ED Medications  sorbitol, milk of mag, mineral oil, glycerin (SMOG) enema (0 mLs Rectal Hold 11/12/22 2149)  HYDROmorphone (DILAUDID) injection 1 mg (has no administration in time range)  morphine (PF) 4 MG/ML injection 4 mg (4 mg Intravenous Given 11/12/22 2112)  ondansetron (ZOFRAN) injection 4 mg (4 mg Intravenous Given 11/12/22 2112)  sodium chloride 0.9 % bolus 1,000 mL (1,000 mLs Intravenous New Bag/Given 11/12/22 2111)    ED Course/ Medical Decision Making/ A&P                           Medical Decision Making Amount and/or Complexity of Data Reviewed Labs: ordered. Radiology: ordered.  Risk Prescription drug  management.   This patient presents to the ED for concern of abd pain, this involves an extensive number of treatment options, and is a complaint that carries with it a high risk of complications and morbidity.  The differential diagnosis includes infection, sbo, constipation   Co morbidities that complicate the patient evaluation  CAD, HLD, and kidney stones   Additional history obtained:  Additional history obtained from epic chart review External records from outside source obtained and reviewed including  EMS report   Lab Tests:  I Ordered, and personally interpreted labs.  The pertinent results include:  ua nl, cbc nl, cmp nl   Imaging Studies ordered:  I ordered imaging studies including kub3  I independently visualized and interpreted imaging which showed  No active disease in the chest. Nonobstructed gas pattern with  moderate stool burden.   I agree with the radiologist interpretation   Cardiac Monitoring:  The patient was maintained on a cardiac monitor.  I personally viewed and interpreted the cardiac monitored which showed an underlying rhythm of: nsc   Medicines ordered and prescription drug management:  I ordered medication including smog enema  for constipation which is pending at shift change.  Pt signed out to Dr. Clayborne Dana.   Problem List / ED Course:  Post-op pain:  incisions look good.  Swelling and bruising is the expected amt. Constipation:  smog enema given   Reevaluation:  After the interventions noted above, I reevaluated the patient and found that they have :improved   Social Determinants of Health:  Lives at home   Dispostion:  After consideration of the diagnostic results and the patients response to treatment, I feel that the patent would benefit from discharge with outpatient f/u.          Final Clinical Impression(s) / ED Diagnoses Final diagnoses:  Post-operative pain  Drug-induced constipation    Rx / DC Orders ED  Discharge Orders          Ordered    senna-docusate (SENOKOT-S) 8.6-50 MG tablet  At bedtime PRN        11/12/22 2332              Jacalyn Lefevre, MD 11/12/22 2333

## 2022-11-13 NOTE — ED Notes (Signed)
Pt on bedside commode after enema. Pt was able to get about half of enema before having to stop. Pt instructed to hold enema as long as possible until he feels the urge to defecate. Call light within reach.

## 2022-11-13 NOTE — ED Provider Notes (Signed)
1:09 AM Assumed care from Dr. Particia Nearing, please see their note for full history, physical and decision making until this point. In brief this is a 56 y.o. year old male who presented to the ED tonight with Post-op Problem     Recent hernia repair and also with chronic opioid use. Here for constipation. Getting enema. Needs reeval for improvement before d/c.   Multiple BM's. Advised hydration, fiber and colace while on narcotics. Miralax PRN for effect. PCP/surg follow up PRN.  Discharge instructions, including strict return precautions for new or worsening symptoms, given. Patient and/or family verbalized understanding and agreement with the plan as described.   Labs, studies and imaging reviewed by myself and considered in medical decision making if ordered. Imaging interpreted by radiology.  Labs Reviewed  COMPREHENSIVE METABOLIC PANEL - Abnormal; Notable for the following components:      Result Value   Calcium 8.3 (*)    Total Protein 6.2 (*)    All other components within normal limits  CBC WITH DIFFERENTIAL/PLATELET - Abnormal; Notable for the following components:   Monocytes Absolute 1.3 (*)    All other components within normal limits  URINALYSIS, ROUTINE W REFLEX MICROSCOPIC    DG Abdomen Acute W/Chest  Final Result      No follow-ups on file.    Onyx Edgley, Barbara Cower, MD 11/13/22 [phone]

## 2022-11-15 ENCOUNTER — Encounter (HOSPITAL_COMMUNITY): Payer: Self-pay | Admitting: General Surgery

## 2022-12-06 ENCOUNTER — Encounter: Payer: Self-pay | Admitting: Nurse Practitioner

## 2022-12-06 ENCOUNTER — Encounter: Payer: 59 | Admitting: Nurse Practitioner

## 2022-12-06 NOTE — Progress Notes (Signed)
Secure message sent to patient advising ED visit for complaints. No show to Video Visit

## 2022-12-07 ENCOUNTER — Emergency Department (HOSPITAL_COMMUNITY)
Admission: EM | Admit: 2022-12-07 | Discharge: 2022-12-07 | Disposition: A | Discharge status: Home / Self Care | Payer: 59 | Attending: Emergency Medicine | Admitting: Emergency Medicine

## 2022-12-07 ENCOUNTER — Telehealth: Payer: Self-pay | Admitting: *Deleted

## 2022-12-07 ENCOUNTER — Other Ambulatory Visit: Payer: Self-pay

## 2022-12-07 ENCOUNTER — Emergency Department (HOSPITAL_COMMUNITY): Payer: 59

## 2022-12-07 ENCOUNTER — Encounter (HOSPITAL_COMMUNITY): Payer: Self-pay | Admitting: *Deleted

## 2022-12-07 DIAGNOSIS — N433 Hydrocele, unspecified: Secondary | ICD-10-CM | POA: Diagnosis not present

## 2022-12-07 DIAGNOSIS — N503 Cyst of epididymis: Secondary | ICD-10-CM | POA: Diagnosis not present

## 2022-12-07 DIAGNOSIS — N50811 Right testicular pain: Secondary | ICD-10-CM

## 2022-12-07 DIAGNOSIS — I2510 Atherosclerotic heart disease of native coronary artery without angina pectoris: Secondary | ICD-10-CM | POA: Insufficient documentation

## 2022-12-07 DIAGNOSIS — Z7982 Long term (current) use of aspirin: Secondary | ICD-10-CM | POA: Insufficient documentation

## 2022-12-07 DIAGNOSIS — R9389 Abnormal findings on diagnostic imaging of other specified body structures: Secondary | ICD-10-CM | POA: Insufficient documentation

## 2022-12-07 DIAGNOSIS — N5089 Other specified disorders of the male genital organs: Secondary | ICD-10-CM | POA: Insufficient documentation

## 2022-12-07 DIAGNOSIS — Z87891 Personal history of nicotine dependence: Secondary | ICD-10-CM | POA: Diagnosis not present

## 2022-12-07 DIAGNOSIS — N432 Other hydrocele: Secondary | ICD-10-CM | POA: Diagnosis not present

## 2022-12-07 DIAGNOSIS — I861 Scrotal varices: Secondary | ICD-10-CM | POA: Diagnosis not present

## 2022-12-07 LAB — CBC WITH DIFFERENTIAL/PLATELET
Abs Immature Granulocytes: 0.03 10*3/uL (ref 0.00–0.07)
Basophils Absolute: 0.1 10*3/uL (ref 0.0–0.1)
Basophils Relative: 1 %
Eosinophils Absolute: 0.2 10*3/uL (ref 0.0–0.5)
Eosinophils Relative: 3 %
HCT: 54.3 % — ABNORMAL HIGH (ref 39.0–52.0)
Hemoglobin: 18.2 g/dL — ABNORMAL HIGH (ref 13.0–17.0)
Immature Granulocytes: 0 %
Lymphocytes Relative: 33 %
Lymphs Abs: 2.6 10*3/uL (ref 0.7–4.0)
MCH: 30.5 pg (ref 26.0–34.0)
MCHC: 33.5 g/dL (ref 30.0–36.0)
MCV: 91.1 fL (ref 80.0–100.0)
Monocytes Absolute: 0.9 10*3/uL (ref 0.1–1.0)
Monocytes Relative: 11 %
Neutro Abs: 4.2 10*3/uL (ref 1.7–7.7)
Neutrophils Relative %: 52 %
Platelets: 235 10*3/uL (ref 150–400)
RBC: 5.96 MIL/uL — ABNORMAL HIGH (ref 4.22–5.81)
RDW: 12.8 % (ref 11.5–15.5)
WBC: 7.9 10*3/uL (ref 4.0–10.5)
nRBC: 0 % (ref 0.0–0.2)

## 2022-12-07 LAB — BASIC METABOLIC PANEL
Anion gap: 4 — ABNORMAL LOW (ref 5–15)
BUN: 15 mg/dL (ref 6–20)
CO2: 27 mmol/L (ref 22–32)
Calcium: 8.7 mg/dL — ABNORMAL LOW (ref 8.9–10.3)
Chloride: 104 mmol/L (ref 98–111)
Creatinine, Ser: 1.03 mg/dL (ref 0.61–1.24)
GFR, Estimated: >60 mL/min (ref >60)
Glucose, Bld: 123 mg/dL — ABNORMAL HIGH (ref 70–99)
Potassium: 4.2 mmol/L (ref 3.5–5.1)
Sodium: 135 mmol/L (ref 135–145)

## 2022-12-07 LAB — URINALYSIS, ROUTINE W REFLEX MICROSCOPIC
Bilirubin Urine: NEGATIVE
Glucose, UA: NEGATIVE mg/dL
Hgb urine dipstick: NEGATIVE
Ketones, ur: NEGATIVE mg/dL
Leukocytes,Ua: NEGATIVE
Nitrite: NEGATIVE
Protein, ur: NEGATIVE mg/dL
Specific Gravity, Urine: 1.012 (ref 1.005–1.030)
pH: 5 (ref 5.0–8.0)

## 2022-12-07 MED ORDER — OXYCODONE-ACETAMINOPHEN 5-325 MG PO TABS: 1.0000 | ORAL_TABLET | Freq: Three times a day (TID) | ORAL | 0 refills | Status: DC | PRN

## 2022-12-07 NOTE — Telephone Encounter (Signed)
Discussed care with Dr. Constance Haw whose recommendations are as follows: Return to ER for evaluation of possible recurrence, hematoma, testicular torsion.   Call placed to patient. [place].

## 2022-12-07 NOTE — ED Provider Notes (Signed)
[facility] EMERGENCY DEPARTMENT Provider Note   CSN: [id number] Arrival date & time: 12/07/22  1519     History  Chief Complaint  Patient presents with   Testicle Pain    Christopher [name] is a 57 y.o. male.   Testicle Pain  Patient presents with right testicle pain.  Some swelling.  Has had hernia repair around a month ago.  Had some swelling of the time but then decreased and now states it hurts again.  Difficulty with certain positions.  States it feels as if there is a swelling in there.  No dysuria but states his urine may be darker than normal.  No trauma.  Has an appointment with general surgery tomorrow.    Past Medical History:  Diagnosis Date   Arthritis    CAD (coronary artery disease)    remote PCI   Cocaine abuse ([place])    last used 1 year ago   Dyslipidemia    History of kidney stones    NSTEMI (non-ST elevated myocardial infarction) ([place]) 11/19/2017   in setting of cocaine use   Smoker     Home Medications Prior to Admission medications   Medication Sig Start Date End Date Taking? Authorizing Provider  oxyCODONE-acetaminophen (PERCOCET/ROXICET) 5-325 MG tablet Take 1-2 tablets by mouth every 8 (eight) hours as needed for severe pain. 12/07/22  Yes Davonna Belling, MD  aspirin EC 81 MG tablet Take 1 tablet (81 mg total) by mouth daily. Swallow whole. 10/06/22   Janina Mayo, MD  aspirin-acetaminophen-caffeine (EXCEDRIN MIGRAINE) [phone] MG tablet Take 2 tablets by mouth every 6 (six) hours as needed for headache or migraine.    [provider]  ezetimibe (ZETIA) 10 MG tablet Take 1 tablet (10 mg total) by mouth daily. 10/06/22   Janina Mayo, MD  nitroGLYCERIN (NITROSTAT) 0.4 MG SL tablet Place 1 tablet (0.4 mg total) under the tongue every 5 (five) minutes as needed for chest pain. DO NOT TAKE MORE THAN 3 DOSES IN 15 MINUTES 10/06/22 01/04/23  Janina Mayo, MD  ondansetron (ZOFRAN) 4 MG tablet Take 1 tablet (4 mg total) by mouth every 8 (eight)  hours as needed. 11/10/22 11/10/23  Virl Cagey, MD  pantoprazole (PROTONIX) 40 MG tablet Take 1 tablet (40 mg total) by mouth 2 (two) times daily. 10/28/22 10/28/23  Eloise Harman, DO  polyethylene glycol (MIRALAX) 17 g packet Take 17 g by mouth daily. 11/10/22   Virl Cagey, MD  senna-docusate (SENOKOT-S) 8.6-50 MG tablet Take 1 tablet by mouth at bedtime as needed for mild constipation. 11/12/22   Isla Pence, MD      Allergies    Hydrocodone, Naproxen, and Lipitor [atorvastatin calcium]    Review of Systems   Review of Systems  Genitourinary:  Positive for testicular pain.    Physical Exam Updated Vital Signs BP 131/88 (BP Location: Right Arm)   Pulse 77   Temp 97.8 F (36.6 C) (Oral)   Resp 18   Wt 116.1 kg   SpO2 99%   BMI 37.80 kg/m  Physical Exam Vitals and nursing note reviewed.  Abdominal:     Tenderness: There is no abdominal tenderness.  Genitourinary:    Comments: Normal lie on right testicle.  Mild tenderness of testicle.  No skin changes.  Some firmness in the right groin, potentially postsurgical.  No hernia felt with palpation up through the scrotum into the inguinal area. Musculoskeletal:     Cervical back: Neck supple.  Skin:    General: Skin is warm.     Capillary Refill: Capillary refill takes less than 2 seconds.  Neurological:     Mental Status: He is alert.     ED Results / Procedures / Treatments   Labs (all labs ordered are listed, but only abnormal results are displayed) Labs Reviewed  CBC WITH DIFFERENTIAL/PLATELET - Abnormal; Notable for the following components:      Result Value   RBC 5.96 (*)    Hemoglobin 18.2 (*)    HCT 54.3 (*)    All other components within normal limits  BASIC METABOLIC PANEL - Abnormal; Notable for the following components:   Glucose, Bld 123 (*)    Calcium 8.7 (*)    Anion gap 4 (*)    All other components within normal limits  URINALYSIS, ROUTINE W REFLEX MICROSCOPIC     EKG None  Radiology US SCROTUM W/DOPPLER  Result Date: 12/07/2022 CLINICAL DATA:  Testicle pain EXAM: SCROTAL ULTRASOUND DOPPLER ULTRASOUND OF THE TESTICLES TECHNIQUE: Complete ultrasound examination of the testicles, epididymis, and other scrotal structures was performed. Color and spectral Doppler ultrasound were also utilized to evaluate blood flow to the testicles. COMPARISON:  Scrotal ultrasound 03/31/2020 FINDINGS: Right testicle Measurements: 4.1 x 1.8 x 2.9 cm. No intraparenchymal mass is seen. Multiple small solid-appearing intrascrotal, extra parenchymal nodules. The largest is seen anteriorly and measures 7 by 5 x 7 mm, previously this measured 5 mm. Small echogenic mass separate from testis and epididymis, this measures 4 x 5 by 5 mm, previously this measured 4 mm. Left testicle Measurements: 3.3 x 1.9 x 3.2 cm. No mass or microlithiasis visualized. Right epididymis:  Multiple cysts, the largest measures 8 mm Left epididymis:  Normal in size and appearance. Hydrocele: Small complex right hydrocele containing mobile particulate debris. Varicocele:  Borderline to small right varicocele. Pulsed Doppler interrogation of both testes demonstrates normal low resistance arterial and venous waveforms bilaterally. IMPRESSION: 1. Negative for testicular torsion. 2. Multiple small solid-appearing intrascrotal, extra parenchymal nodules in the right scrotum, the largest measures 7 mm. Small masses were present on scrotal ultrasound from 2021, the larger lesion is increased in size by few mm. These are favored to be benign given only little change in size compared to prior, however consider nonemergent urology follow-up. 3. Small complex right hydrocele containing mobile particulate debris. 4. Borderline to small right varicocele. 5. Multiple epididymal cysts Electronically Signed   By: Donavan Foil M.D.   On: 12/07/2022 16:43    Procedures Procedures    Medications Ordered in ED Medications - No  data to display  ED Course/ Medical Decision Making/ A&P                           Medical Decision Making Amount and/or Complexity of Data Reviewed Labs: ordered.  Risk Prescription drug management.   Patient with right testicle pain.  Had had previous surgery.  Urine does not show infection.  White count reassuring.  Some tenderness to the testicle on the right.  Normal lie.  Will get ultrasound to evaluate for infection or less likely torsion.  Hernia felt less likely.  Does not appear to be cellulitis.  Ultrasound done and she does hydrocele.  Also some chronic nodules.  Likely not malignant but follow-up recommended.  Discussed with patient.  Will treat with some pain meds with urology follow-up.  Urine does not show infection.  Doubt recurrence of hernia and does  have follow-up with surgeon tomorrow.  Will discharge home.        Final Clinical Impression(s) / ED Diagnoses Final diagnoses:  Pain in right testicle  Hydrocele, unspecified hydrocele type    Rx / DC Orders ED Discharge Orders          Ordered    oxyCODONE-acetaminophen (PERCOCET/ROXICET) 5-325 MG tablet  Every 8 hours PRN        12/07/22 1715              Benjiman Core, MD 12/07/22 1718

## 2022-12-07 NOTE — ED Notes (Signed)
EDP Pick[place] at bedside. Kalan Rinn Jaymond Waage,RN 

## 2022-12-07 NOTE — ED Triage Notes (Signed)
Pt with recent hernia surgery, testicle swelling and left side swelling has decreased.  Right testicle swelling and pain, warm to touch per pt. Denies any problems with urination.  +odor to urine per pt.

## 2022-12-07 NOTE — ED Notes (Signed)
Pt ambulated to restoom with steady gait. Christopher [name]

## 2022-12-07 NOTE — Telephone Encounter (Signed)
Surgical Date: 11/10/2022 Procedure: Open Inguinal Hernia Repair W/ Mesh, Right  Received call from patient [phone]~ telephone.   Patient reports x 10 days of swelling to right scrotum that is red and warm to touch. States that he has severe pain in scrotum/ testicle and lower abdomen. States that he also has numbness to incision sites and radiates down leg.   Patient reports that swelling and pain is worsening daily. Reports that he feels he can barely move due to pain.   Patient denies fever, nausea, etc, but does state that he is having night sweats.   Please advise.

## 2022-12-07 NOTE — Telephone Encounter (Signed)
Patient returned call and made aware of provider recommendations.   States that he will go to ER once his spouse returns with car  in approximately 30 minutes.

## 2022-12-08 ENCOUNTER — Encounter: Payer: Self-pay | Admitting: General Surgery

## 2022-12-08 ENCOUNTER — Ambulatory Visit (INDEPENDENT_AMBULATORY_CARE_PROVIDER_SITE_OTHER): Payer: 59 | Admitting: General Surgery

## 2022-12-08 VITALS — BP 124/79 | HR 96 | Temp 97.7°F | Resp 16 | Ht 69.0 in | Wt 260.0 lb

## 2022-12-08 DIAGNOSIS — K4020 Bilateral inguinal hernia, without obstruction or gangrene, not specified as recurrent: Secondary | ICD-10-CM

## 2022-12-08 DIAGNOSIS — K429 Umbilical hernia without obstruction or gangrene: Secondary | ICD-10-CM

## 2022-12-08 NOTE — Patient Instructions (Signed)
No heavy lifting > 10 lbs, excessive bending, pushing, pulling, or squatting for 6-8 weeks after surgery.  Swelling and pain will continue to get better.  You have a little bit of a hematoma (blood and fluid) in your scrotum from the surgery. This will improve. You have some nodules in your scrotum that were there previously and appear stable. We will get another [place] in 6-8 weeks to show that this is stable.

## 2022-12-08 NOTE — Progress Notes (Signed)
[facility]  Seen in ED yesterday due to calling the office and reporting redness and swelling at his incision inferior. Work up in the ED with hematoma in the scrotum and benign nodules maybe slightly larger than prior.   ED recommended Urology follow up. I discussed with McKenzie this AM and he says that he does not need to see him he just needs a [place] repeated in 6-8 weeks.  Patient says the pain is shooting at times and swelling is dependent in the scrotum BP 124/79   Pulse 96   Temp 97.7 F (36.5 C) (Oral)   Resp 16   Ht 5\' 9"  (1.753 m)   Wt 260 lb (117.9 kg)   SpO2 94%   BMI 38.40 kg/m  Incision healing, minor induration, no recurrence noted, no erythema or drainage  Patient with scrotal hematoma on [place] after inguinal hernia. Wound looks well.   No heavy lifting > 10 lbs, excessive bending, pushing, pulling, or squatting for 6-8 weeks after surgery.  Swelling and pain will continue to get better. [place] in 6-8 weeks, will see in 4 weeks   Future Appointments  Date Time Provider [place]  01/05/2023  2:15 PM Virl Cagey, MD [facility] None  01/30/2023  8:30 AM Sherron Monday, NP [facility]  04/06/2023 10:20 AM Janina Mayo, MD [facility] None   Curlene Labrum, MD [facility] [address] [place], [place] [address] [phone] (office)

## 2023-01-05 ENCOUNTER — Encounter: Payer: 59 | Admitting: General Surgery

## 2023-01-09 ENCOUNTER — Ambulatory Visit: Payer: 59 | Admitting: Family Medicine

## 2023-01-30 ENCOUNTER — Ambulatory Visit: Payer: 59 | Admitting: Gastroenterology

## 2023-01-31 DIAGNOSIS — R21 Rash and other nonspecific skin eruption: Secondary | ICD-10-CM | POA: Diagnosis not present

## 2023-01-31 DIAGNOSIS — E669 Obesity, unspecified: Secondary | ICD-10-CM | POA: Diagnosis not present

## 2023-01-31 DIAGNOSIS — Z6837 Body mass index (BMI) 37.0-37.9, adult: Secondary | ICD-10-CM | POA: Diagnosis not present

## 2023-01-31 DIAGNOSIS — E611 Iron deficiency: Secondary | ICD-10-CM | POA: Diagnosis not present

## 2023-02-08 ENCOUNTER — Ambulatory Visit (INDEPENDENT_AMBULATORY_CARE_PROVIDER_SITE_OTHER): Payer: 59 | Admitting: Internal Medicine

## 2023-02-08 ENCOUNTER — Encounter: Payer: Self-pay | Admitting: Internal Medicine

## 2023-02-08 VITALS — BP 126/88 | HR 79 | Resp 16 | Ht 69.0 in | Wt 253.1 lb

## 2023-02-08 DIAGNOSIS — Z720 Tobacco use: Secondary | ICD-10-CM

## 2023-02-08 DIAGNOSIS — L309 Dermatitis, unspecified: Secondary | ICD-10-CM

## 2023-02-08 DIAGNOSIS — E785 Hyperlipidemia, unspecified: Secondary | ICD-10-CM | POA: Diagnosis not present

## 2023-02-08 DIAGNOSIS — N492 Inflammatory disorders of scrotum: Secondary | ICD-10-CM | POA: Diagnosis not present

## 2023-02-08 DIAGNOSIS — F17200 Nicotine dependence, unspecified, uncomplicated: Secondary | ICD-10-CM

## 2023-02-08 DIAGNOSIS — Z0001 Encounter for general adult medical examination with abnormal findings: Secondary | ICD-10-CM | POA: Diagnosis not present

## 2023-02-08 DIAGNOSIS — Z9889 Other specified postprocedural states: Secondary | ICD-10-CM

## 2023-02-08 DIAGNOSIS — Z8719 Personal history of other diseases of the digestive system: Secondary | ICD-10-CM | POA: Diagnosis not present

## 2023-02-08 DIAGNOSIS — L245 Irritant contact dermatitis due to other chemical products: Secondary | ICD-10-CM

## 2023-02-08 MED ORDER — NICOTINE 21 MG/24HR TD PT24: 21.0000 mg | MEDICATED_PATCH | Freq: Every day | TRANSDERMAL | 1 refills | Status: AC

## 2023-02-08 MED ORDER — TRIAMCINOLONE ACETONIDE 0.1 % EX CREA: 1.0000 | TOPICAL_CREAM | Freq: Two times a day (BID) | CUTANEOUS | 0 refills | Status: AC

## 2023-02-08 NOTE — Progress Notes (Unsigned)
HPI:[name] is a 57 y.o. male who presents to establish care and evaluation of rash on left arm, testicle pain, and lump in right side.   Testicle pain: Continue to have right scrotal pain and some swelling. We reviewed history and he had [place] in ED. He was recommended to follow up with Urologogy. Dr. Constance [name] reviewed with Dr. Alyson [name] and had plans to repeat US. Patient missed apoitment with Dr.Briges  Rash on left forearm: Had some contact with ink from moving boxes. Then developed pruritic rash. He was seen at urgent care last Monday or Tuesday. They gave him steroid taper and topical cream. Rash is improving but not resolved. He has finished steroid taper and cream.  Lump in right side: Recent hernia surgery and having some discomfort and feels lump where surgery was done.   Past Medical History:  Diagnosis Date   Arthritis    CAD (coronary artery disease)    remote PCI   Cocaine abuse ([place])    last used 1 year ago   Dyslipidemia    History of kidney stones    NSTEMI (non-ST elevated myocardial infarction) ([place]) 11/19/2017   in setting of cocaine use   Smoker     Past Surgical History:  Procedure Laterality Date   BIOPSY  10/28/2022   Procedure: BIOPSY;  Surgeon: Eloise Harman, DO;  [facility];  Service: Endoscopy;;   COLONOSCOPY WITH PROPOFOL N/A 10/28/2022   Procedure: COLONOSCOPY WITH PROPOFOL;  Surgeon: Eloise Harman, DO;  [facility]: [facility];  Service: Endoscopy;  Laterality: N/A;  1:45 pm   CORONARY ANGIOPLASTY WITH STENT PLACEMENT     ESOPHAGOGASTRODUODENOSCOPY (EGD) WITH PROPOFOL N/A 10/28/2022   Procedure: ESOPHAGOGASTRODUODENOSCOPY (EGD) WITH PROPOFOL;  Surgeon: Eloise Harman, DO;  Location: [facility];  Service: Endoscopy;  Laterality: N/A;   HERNIA REPAIR     INGUINAL HERNIA REPAIR Right 11/10/2022   Procedure: OPEN HERNIA REPAIR INGUINAL ADULT WITH MESH;  Surgeon: Virl Cagey, MD;  Location: [facility] ORS;  Service:  General;  Laterality: Right;   POLYPECTOMY  10/28/2022   Procedure: POLYPECTOMY;  Surgeon: Eloise Harman, DO;  [facility];  Service: Endoscopy;;   UMBILICAL HERNIA REPAIR  11/10/2022   Procedure: HERNIA REPAIR UMBILICAL ADULT;  Surgeon: Virl Cagey, MD;  Location: [facility] OR[facility];  Service: General;;    Family History  Problem Relation Age of Onset   Diabetes Mother    Cancer Mother        bone marrow   Diabetes Father    Hypertension Father    Coronary artery disease Father 14       CABG   Alzheimer's disease Father     Social History   Tobacco Use   Smoking status: Every Day    Packs/day: 1.00    Years: 30.00    Additional pack years: 0.00    Total pack years: 30.00    Types: Cigarettes   Smokeless tobacco: Never  Vaping Use   Vaping Use: Never used  Substance Use Topics   Alcohol use: No   Drug use: Not Currently    Types: Cocaine    Comment: last used 1 year ago-10/26/2022    Review of Systems:    ROS   Physical Exam: Vitals:   02/08/23 1313  BP: 126/88  Pulse: 79  Resp: 16  SpO2: 94%  Weight: 253 lb 1.9 oz (114.8 kg)  Height: '5\' 9"'$  (1.753 m)  Physical Exam Constitutional:      General: He is not in acute distress.    Appearance: He is not ill-appearing.  Cardiovascular:     Rate and Rhythm: Normal rate and regular rhythm.     Heart sounds: No murmur heard. Pulmonary:     Effort: Pulmonary effort is normal. No respiratory distress.     Breath sounds: No wheezing.  Abdominal:     Comments: Surgical scar in right inguinal groove, no erythema , mild bulge, not worsened with valsalva.      Assessment & Plan:   S/P right inguinal hernia repair Patient had right inguinal hernia repair 10/2022.  He missed his follow-up with Dr. Constance [name] recently.  He is having some pain at surgical site. This is not new since his appointment in January with surgeon.  No erythema or signs of infection.  Recommend contacting Dr. Constance [name] office and  rescheduling a follow-up visit for further evaluation.  Smoker Patient has 30-pack-year smoking history.  He is interested in quitting today and will start nicotine patches.  He will follow-up for stepdown therapy.  Will also send patient for LDCT to screen for lung cancer.   Dyslipidemia Hx of NSTEMI and PCI. On Asprin and Zetia.  Check Lipid Panel   Contact dermatitis Apply steroid cream twice daily for a week Follow up if not improving   Nodule of scrotum Recommendation to have follow up US in 6 to 8 weeks for nodules in Scrotum. Missed appointment with general surgery. Scrotum US  Encounter for general adult medical examination with abnormal findings Here to establish care. Obtain baseline labs.     Lorene Dy, MD

## 2023-02-08 NOTE — Patient Instructions (Signed)
Thank you, [name] for allowing [place] to provide your care today.   I have ordered the following labs for you:   Lab Orders         Lipid panel         CMP14+EGFR         CBC with Differential/Platelet         TSH         Hemoglobin A1c         Hepatitis C Antibody       Reminders: I will follow up with results of your lab work and studies . Contact Dr.Bridges to schedule follow up for hernia.     Tamsen Snider, M.D.

## 2023-02-09 ENCOUNTER — Other Ambulatory Visit: Payer: Self-pay | Admitting: Internal Medicine

## 2023-02-09 DIAGNOSIS — L259 Unspecified contact dermatitis, unspecified cause: Secondary | ICD-10-CM | POA: Insufficient documentation

## 2023-02-09 DIAGNOSIS — Z0001 Encounter for general adult medical examination with abnormal findings: Secondary | ICD-10-CM | POA: Insufficient documentation

## 2023-02-09 DIAGNOSIS — Z8719 Personal history of other diseases of the digestive system: Secondary | ICD-10-CM | POA: Insufficient documentation

## 2023-02-09 DIAGNOSIS — N492 Inflammatory disorders of scrotum: Secondary | ICD-10-CM | POA: Insufficient documentation

## 2023-02-09 LAB — CBC WITH DIFFERENTIAL/PLATELET
Basophils Absolute: 0.1 10*3/uL (ref 0.0–0.2)
Basos: 1 %
EOS (ABSOLUTE): 0.2 10*3/uL (ref 0.0–0.4)
Eos: 3 %
Hematocrit: 54.3 % — ABNORMAL HIGH (ref 37.5–51.0)
Hemoglobin: 18.3 g/dL — ABNORMAL HIGH (ref 13.0–17.7)
Immature Grans (Abs): 0.1 10*3/uL (ref 0.0–0.1)
Immature Granulocytes: 1 %
Lymphocytes Absolute: 2.1 10*3/uL (ref 0.7–3.1)
Lymphs: 28 %
MCH: 30.6 pg (ref 26.6–33.0)
MCHC: 33.7 g/dL (ref 31.5–35.7)
MCV: 91 fL (ref 79–97)
Monocytes Absolute: 0.8 10*3/uL (ref 0.1–0.9)
Monocytes: 11 %
Neutrophils Absolute: 4.2 10*3/uL (ref 1.4–7.0)
Neutrophils: 56 %
Platelets: 271 10*3/uL (ref 150–450)
RBC: 5.98 x10E6/uL — ABNORMAL HIGH (ref 4.14–5.80)
RDW: 13 % (ref 11.6–15.4)
WBC: 7.4 10*3/uL (ref 3.4–10.8)

## 2023-02-09 LAB — CMP14+EGFR
ALT: 53 IU/L — ABNORMAL HIGH (ref 0–44)
AST: 31 IU/L (ref 0–40)
Albumin/Globulin Ratio: 2 (ref 1.2–2.2)
Albumin: 4.4 g/dL (ref 3.8–4.9)
Alkaline Phosphatase: 77 IU/L (ref 44–121)
BUN/Creatinine Ratio: 11 (ref 9–20)
BUN: 11 mg/dL (ref 6–24)
Bilirubin Total: 0.6 mg/dL (ref 0.0–1.2)
CO2: 23 mmol/L (ref 20–29)
Calcium: 9.5 mg/dL (ref 8.7–10.2)
Chloride: 103 mmol/L (ref 96–106)
Creatinine, Ser: 1.01 mg/dL (ref 0.76–1.27)
Globulin, Total: 2.2 g/dL (ref 1.5–4.5)
Glucose: 88 mg/dL (ref 70–99)
Potassium: 4.6 mmol/L (ref 3.5–5.2)
Sodium: 139 mmol/L (ref 134–144)
Total Protein: 6.6 g/dL (ref 6.0–8.5)
eGFR: 87 mL/min/{1.73_m2} (ref >59)

## 2023-02-09 LAB — LIPID PANEL
Chol/HDL Ratio: 6.7 ratio — ABNORMAL HIGH (ref 0.0–5.0)
Cholesterol, Total: 236 mg/dL — ABNORMAL HIGH (ref 100–199)
HDL: 35 mg/dL — ABNORMAL LOW (ref >39)
LDL Chol Calc (NIH): 162 mg/dL — ABNORMAL HIGH (ref 0–99)
Triglycerides: 207 mg/dL — ABNORMAL HIGH (ref 0–149)
VLDL Cholesterol Cal: 39 mg/dL (ref 5–40)

## 2023-02-09 LAB — HEMOGLOBIN A1C
Est. average glucose Bld gHb Est-mCnc: 123 mg/dL
Hgb A1c MFr Bld: 5.9 % — ABNORMAL HIGH (ref 4.8–5.6)

## 2023-02-09 LAB — HEPATITIS C ANTIBODY: Hep C Virus Ab: NONREACTIVE

## 2023-02-09 LAB — TSH: TSH: 2.35 u[IU]/mL (ref 0.450–4.500)

## 2023-02-09 MED ORDER — ROSUVASTATIN CALCIUM 20 MG PO TABS: 20.0000 mg | ORAL_TABLET | Freq: Every day | ORAL | 1 refills | Status: AC

## 2023-02-09 NOTE — Assessment & Plan Note (Signed)
Patient has 30-pack-year smoking history.  He is interested in quitting today and will start nicotine patches.  He will follow-up for stepdown therapy.  Will also send patient for LDCT to screen for lung cancer.

## 2023-02-09 NOTE — Assessment & Plan Note (Addendum)
Patient had right inguinal hernia repair 10/2022.  He missed his follow-up with Dr. Constance Haw recently.  He is having some pain at surgical site. This is not new since his appointment in January with surgeon.  No erythema or signs of infection.  Recommend contacting Dr. Constance Haw office and rescheduling a follow-up visit for further evaluation.

## 2023-02-09 NOTE — Progress Notes (Deleted)
Referring Provider: No ref. provider found Primary Care Physician:  Johnette Abraham, MD Primary GI Physician: Dr. Abbey Chatters  No chief complaint on file.   HPI:   Christopher [name] is a 57 y.o. male presenting today with chief complaint of ***  Last seen in our office 07/28/2022 by Dr. Abbey Chatters.  He was planning to schedule first-ever screening colonoscopy without any significant lower GI symptoms.  He did report new onset GERD as well as new onset black stools.  Recommended EGD, colonoscopy.  Procedures completed 10/28/2022: Colonoscopy: Hemorrhoids on perianal exam, diverticulosis in sigmoid colon, two 4-8 mm polyps in the ascending colon and at ileocecal valve resected and retrieved, one 4 mm polyp in sigmoid colon resected and retrieved.  Pathology with hyperplastic/serrated polyp with features suggestive of sessile serrated lesion, and another hyperplastic polyp.  Recommended repeat colonoscopy in 10 years. EGD: 2 cm hiatal hernia, gastritis biopsied, duodenitis.  Recommended Protonix 40 mg twice daily x 12 weeks, then decrease to once daily.  Pathology was benign.  Today:   Past Medical History:  Diagnosis Date   Arthritis    CAD (coronary artery disease)    remote PCI   Cocaine abuse ([place])    last used 1 year ago   Dyslipidemia    History of kidney stones    NSTEMI (non-ST elevated myocardial infarction) ([place]) 11/19/2017   in setting of cocaine use   Smoker     Past Surgical History:  Procedure Laterality Date   BIOPSY  10/28/2022   Procedure: BIOPSY;  Surgeon: Eloise Harman, DO;  [facility];  Service: Endoscopy;;   COLONOSCOPY WITH PROPOFOL N/A 10/28/2022   Procedure: COLONOSCOPY WITH PROPOFOL;  Surgeon: Eloise Harman, DO;  [facility];  Service: Endoscopy;  Laterality: N/A;  1:45 pm   CORONARY ANGIOPLASTY WITH STENT PLACEMENT     ESOPHAGOGASTRODUODENOSCOPY (EGD) WITH PROPOFOL N/A 10/28/2022   Procedure: ESOPHAGOGASTRODUODENOSCOPY (EGD)  WITH PROPOFOL;  Surgeon: Eloise Harman, DO;  [facility];  Service: Endoscopy;  Laterality: N/A;   HERNIA REPAIR     INGUINAL HERNIA REPAIR Right 11/10/2022   Procedure: OPEN HERNIA REPAIR INGUINAL ADULT WITH MESH;  Surgeon: Virl Cagey, MD;  Location: [facility];  Service: General;  Laterality: Right;   POLYPECTOMY  10/28/2022   Procedure: POLYPECTOMY;  Surgeon: Eloise Harman, DO;  [facility];  Service: Endoscopy;;   UMBILICAL HERNIA REPAIR  11/10/2022   Procedure: HERNIA REPAIR UMBILICAL ADULT;  Surgeon: Virl Cagey, MD;  Location: [facility];  Service: General;;    Current Outpatient Medications  Medication Sig Dispense Refill   aspirin EC 81 MG tablet Take 1 tablet (81 mg total) by mouth daily. Swallow whole. 90 tablet 3   aspirin-acetaminophen-caffeine (EXCEDRIN MIGRAINE) 250-250-65 MG tablet Take 2 tablets by mouth every 6 (six) hours as needed for headache or migraine.     ezetimibe (ZETIA) 10 MG tablet Take 1 tablet (10 mg total) by mouth daily. 30 tablet 5   nicotine (NICODERM CQ - DOSED IN MG/24 HOURS) 21 mg/24hr patch Place 1 patch (21 mg total) onto the skin daily. 28 patch 1   nitroGLYCERIN (NITROSTAT) 0.4 MG SL tablet Place 1 tablet (0.4 mg total) under the tongue every 5 (five) minutes as needed for chest pain. DO NOT TAKE MORE THAN 3 DOSES IN 15 MINUTES 25 tablet 3   ondansetron (ZOFRAN) 4 MG tablet Take 1 tablet (4 mg total) by mouth  every 8 (eight) hours as needed. 30 tablet 1   rosuvastatin (CRESTOR) 20 MG tablet Take 1 tablet (20 mg total) by mouth daily. 90 tablet 1   triamcinolone cream (KENALOG) 0.1 % Apply 1 Application topically 2 (two) times daily. Apply to rash 45 g 0   No current facility-administered medications for this visit.    Allergies as of 02/10/2023 - Review Complete 02/09/2023  Allergen Reaction Noted   Hydrocodone Itching 07/25/2022   Naproxen Itching 10/28/2011   Lipitor [atorvastatin calcium] Rash 12/15/2017     Family History  Problem Relation Age of Onset   Diabetes Mother    Cancer Mother        bone marrow   Diabetes Father    Hypertension Father    Coronary artery disease Father 24       CABG   Alzheimer's disease Father     Social History   Socioeconomic History   Marital status: Married    Spouse name: Not on file   Number of children: Not on file   Years of education: Not on file   Highest education level: Not on file  Occupational History   Not on file  Tobacco Use   Smoking status: Every Day    Packs/day: 1.00    Years: 30.00    Additional pack years: 0.00    Total pack years: 30.00    Types: Cigarettes   Smokeless tobacco: Never  Vaping Use   Vaping Use: Never used  Substance and Sexual Activity   Alcohol use: No   Drug use: Not Currently    Types: Cocaine    Comment: last used 1 year ago-10/26/2022   Sexual activity: Yes  Other Topics Concern   Not on file  Social History Narrative   Not on file   Social Determinants of Health   Financial Resource Strain: Medium Risk (10/06/2022)   Overall Financial Resource Strain (CARDIA)    Difficulty of Paying Living Expenses: Somewhat hard  Food Insecurity: Not on file  Transportation Needs: Not on file  Physical Activity: Not on file  Stress: Not on file  Social Connections: Not on file    Review of Systems: Gen: Denies fever, chills, anorexia. Denies fatigue, weakness, weight loss.  CV: Denies chest pain, palpitations, syncope, peripheral edema, and claudication. Resp: Denies dyspnea at rest, cough, wheezing, coughing up blood, and pleurisy. GI: Denies vomiting blood, jaundice, and fecal incontinence.   Denies dysphagia or odynophagia. Derm: Denies rash, itching, dry skin Psych: Denies depression, anxiety, memory loss, confusion. No homicidal or suicidal ideation.  Heme: Denies bruising, bleeding, and enlarged lymph nodes.  Physical Exam: There were no vitals taken for this visit. General:   Alert and  oriented. No distress noted. Pleasant and cooperative.  Head:  Normocephalic and atraumatic. Eyes:  Conjuctiva clear without scleral icterus. Heart:  S1, S2 present without murmurs appreciated. Lungs:  Clear to auscultation bilaterally. No wheezes, rales, or rhonchi. No distress.  Abdomen:  +BS, soft, non-tender and non-distended. No rebound or guarding. No HSM or masses noted. Msk:  Symmetrical without gross deformities. Normal posture. Extremities:  Without edema. Neurologic:  Alert and  oriented x4 Psych:  Normal mood and affect.    Assessment:     Plan:  ***   Aliene Altes, PA-C [facility] 02/10/2023

## 2023-02-09 NOTE — Assessment & Plan Note (Signed)
Here to establish care. Obtain baseline labs.

## 2023-02-09 NOTE — Assessment & Plan Note (Signed)
Recommendation to have follow up US in 6 to 8 weeks for nodules in Scrotum. Missed appointment with general surgery. Scrotum US

## 2023-02-09 NOTE — Assessment & Plan Note (Signed)
Apply steroid cream twice daily for a week Follow up if not improving

## 2023-02-09 NOTE — Assessment & Plan Note (Addendum)
Hx of NSTEMI and PCI. On Asprin and Zetia.  Check Lipid Panel

## 2023-02-10 ENCOUNTER — Ambulatory Visit: Payer: Medicaid Other | Admitting: Gastroenterology

## 2023-02-10 ENCOUNTER — Encounter: Payer: Self-pay | Admitting: Gastroenterology

## 2023-02-16 NOTE — Progress Notes (Deleted)
Referring Provider: Johnette Abraham, MD Primary Care Physician:  Johnette Abraham, MD Primary GI Physician: Dr. Abbey Chatters  No chief complaint on file.   HPI:   Christopher [name] is a 57 y.o. male presenting today with chief complaint of ***   Last seen in our office 07/28/2022 by Dr. Abbey Chatters.  He was planning to schedule first-ever screening colonoscopy. Had no significant lower GI symptoms.  He did report new onset GERD as well as new onset black stools.  Recommended EGD, colonoscopy.   Procedures completed 10/28/2022: Colonoscopy: Hemorrhoids on perianal exam, diverticulosis in sigmoid colon, two 4-8 mm polyps in the ascending colon and at ileocecal valve resected and retrieved, one 4 mm polyp in sigmoid colon resected and retrieved.  Pathology with hyperplastic/serrated polyp with features suggestive of sessile serrated lesion, and another hyperplastic polyp.  Recommended repeat colonoscopy in 10 years. EGD: 2 cm hiatal hernia, gastritis biopsied, duodenitis.  Pathology was benign. Recommended Protonix 40 mg twice daily x 12 weeks, then decrease to once daily.     Today:   Rectal bleeding:    GERD:    Past Medical History:  Diagnosis Date   Arthritis    CAD (coronary artery disease)    remote PCI   Cocaine abuse ([place])    last used 1 year ago   Dyslipidemia    History of kidney stones    NSTEMI (non-ST elevated myocardial infarction) ([place]) 11/19/2017   in setting of cocaine use   Smoker     Past Surgical History:  Procedure Laterality Date   BIOPSY  10/28/2022   Procedure: BIOPSY;  Surgeon: Eloise Harman, DO;  Location: [facility];  Service: Endoscopy;;   COLONOSCOPY WITH PROPOFOL N/A 10/28/2022   Procedure: COLONOSCOPY WITH PROPOFOL;  Surgeon: Eloise Harman, DO;  Location: [facility];  Service: Endoscopy;  Laterality: N/A;  1:45 pm   CORONARY ANGIOPLASTY WITH STENT PLACEMENT     ESOPHAGOGASTRODUODENOSCOPY (EGD) WITH PROPOFOL N/A 10/28/2022   Procedure:  ESOPHAGOGASTRODUODENOSCOPY (EGD) WITH PROPOFOL;  Surgeon: Eloise Harman, DO;  Location: [facility];  Service: Endoscopy;  Laterality: N/A;   HERNIA REPAIR     INGUINAL HERNIA REPAIR Right 11/10/2022   Procedure: OPEN HERNIA REPAIR INGUINAL ADULT WITH MESH;  Surgeon: Virl Cagey, MD;  Location: [facility];  Service: General;  Laterality: Right;   POLYPECTOMY  10/28/2022   Procedure: POLYPECTOMY;  Surgeon: Eloise Harman, DO;  Location: [facility];  Service: Endoscopy;;   UMBILICAL HERNIA REPAIR  11/10/2022   Procedure: HERNIA REPAIR UMBILICAL ADULT;  Surgeon: Virl Cagey, MD;  Location: [facility];  Service: General;;    Current Outpatient Medications  Medication Sig Dispense Refill   aspirin EC 81 MG tablet Take 1 tablet (81 mg total) by mouth daily. Swallow whole. 90 tablet 3   aspirin-acetaminophen-caffeine (EXCEDRIN MIGRAINE) 250-250-65 MG tablet Take 2 tablets by mouth every 6 (six) hours as needed for headache or migraine.     ezetimibe (ZETIA) 10 MG tablet Take 1 tablet (10 mg total) by mouth daily. 30 tablet 5   nicotine (NICODERM CQ - DOSED IN MG/24 HOURS) 21 mg/24hr patch Place 1 patch (21 mg total) onto the skin daily. 28 patch 1   nitroGLYCERIN (NITROSTAT) 0.4 MG SL tablet Place 1 tablet (0.4 mg total) under the tongue every 5 (five) minutes as needed for chest pain. DO NOT TAKE MORE THAN 3 DOSES IN 15 MINUTES 25 tablet 3  ondansetron (ZOFRAN) 4 MG tablet Take 1 tablet (4 mg total) by mouth every 8 (eight) hours as needed. 30 tablet 1   rosuvastatin (CRESTOR) 20 MG tablet Take 1 tablet (20 mg total) by mouth daily. 90 tablet 1   triamcinolone cream (KENALOG) 0.1 % Apply 1 Application topically 2 (two) times daily. Apply to rash 45 g 0   No current facility-administered medications for this visit.    Allergies as of 02/17/2023 - Review Complete 02/09/2023  Allergen Reaction Noted   Hydrocodone Itching 07/25/2022   Naproxen Itching 10/28/2011   Lipitor  [atorvastatin calcium] Rash 12/15/2017    Family History  Problem Relation Age of Onset   Diabetes Mother    Cancer Mother        bone marrow   Diabetes Father    Hypertension Father    Coronary artery disease Father 67       CABG   Alzheimer's disease Father     Social History   Socioeconomic History   Marital status: Married    Spouse name: Not on file   Number of children: Not on file   Years of education: Not on file   Highest education level: Not on file  Occupational History   Not on file  Tobacco Use   Smoking status: Every Day    Packs/day: 1.00    Years: 30.00    Additional pack years: 0.00    Total pack years: 30.00    Types: Cigarettes   Smokeless tobacco: Never  Vaping Use   Vaping Use: Never used  Substance and Sexual Activity   Alcohol use: No   Drug use: Not Currently    Types: Cocaine    Comment: last used 1 year ago-10/26/2022   Sexual activity: Yes  Other Topics Concern   Not on file  Social History Narrative   Not on file   Social Determinants of Health   Financial Resource Strain: Medium Risk (10/06/2022)   Overall Financial Resource Strain (CARDIA)    Difficulty of Paying Living Expenses: Somewhat hard  Food Insecurity: Not on file  Transportation Needs: Not on file  Physical Activity: Not on file  Stress: Not on file  Social Connections: Not on file    Review of Systems: Gen: Denies fever, chills, anorexia. Denies fatigue, weakness, weight loss.  CV: Denies chest pain, palpitations, syncope, peripheral edema, and claudication. Resp: Denies dyspnea at rest, cough, wheezing, coughing up blood, and pleurisy. GI: Denies vomiting blood, jaundice, and fecal incontinence.   Denies dysphagia or odynophagia. Derm: Denies rash, itching, dry skin Psych: Denies depression, anxiety, memory loss, confusion. No homicidal or suicidal ideation.  Heme: Denies bruising, bleeding, and enlarged lymph nodes.  Physical Exam: There were no vitals taken  for this visit. General:   Alert and oriented. No distress noted. Pleasant and cooperative.  Head:  Normocephalic and atraumatic. Eyes:  Conjuctiva clear without scleral icterus. Heart:  S1, S2 present without murmurs appreciated. Lungs:  Clear to auscultation bilaterally. No wheezes, rales, or rhonchi. No distress.  Abdomen:  +BS, soft, non-tender and non-distended. No rebound or guarding. No HSM or masses noted. Msk:  Symmetrical without gross deformities. Normal posture. Extremities:  Without edema. Neurologic:  Alert and  oriented x4 Psych:  Normal mood and affect.    Assessment:     Plan:  ***   Aliene Altes, PA-C [facility] 02/17/2023

## 2023-02-17 ENCOUNTER — Encounter (HOSPITAL_COMMUNITY): Payer: Self-pay

## 2023-02-17 ENCOUNTER — Encounter: Payer: Medicaid Other | Admitting: Gastroenterology

## 2023-02-17 ENCOUNTER — Ambulatory Visit (HOSPITAL_COMMUNITY): Admission: RE | Admit: 2023-02-17 | Payer: Medicaid Other | Source: Ambulatory Visit

## 2023-02-20 ENCOUNTER — Encounter: Payer: Self-pay | Admitting: Gastroenterology

## 2023-02-28 ENCOUNTER — Ambulatory Visit (HOSPITAL_COMMUNITY)
Admission: RE | Admit: 2023-02-28 | Discharge: 2023-02-28 | Disposition: A | Discharge status: Home / Self Care | Payer: 59 | Source: Ambulatory Visit | Attending: Internal Medicine | Admitting: Internal Medicine

## 2023-02-28 DIAGNOSIS — N492 Inflammatory disorders of scrotum: Secondary | ICD-10-CM | POA: Diagnosis not present

## 2023-02-28 DIAGNOSIS — N433 Hydrocele, unspecified: Secondary | ICD-10-CM | POA: Diagnosis not present

## 2023-03-21 ENCOUNTER — Telehealth: Payer: Self-pay | Admitting: Internal Medicine

## 2023-03-21 NOTE — Telephone Encounter (Signed)
Christopher [name] from [facility] [phone] called need pre cert through [facility] has a ct lung screen scan tomorrow 04.24.2024.

## 2023-03-22 ENCOUNTER — Ambulatory Visit (HOSPITAL_COMMUNITY): Payer: 59

## 2023-04-06 ENCOUNTER — Ambulatory Visit: Payer: 59 | Attending: Internal Medicine | Admitting: Internal Medicine

## 2023-05-12 ENCOUNTER — Ambulatory Visit: Payer: Medicaid Other | Admitting: Internal Medicine

## 2023-05-16 DIAGNOSIS — F17210 Nicotine dependence, cigarettes, uncomplicated: Secondary | ICD-10-CM | POA: Diagnosis not present

## 2023-05-16 DIAGNOSIS — L539 Erythematous condition, unspecified: Secondary | ICD-10-CM | POA: Diagnosis not present

## 2023-05-16 DIAGNOSIS — L03114 Cellulitis of left upper limb: Secondary | ICD-10-CM | POA: Diagnosis not present

## 2023-05-29 DIAGNOSIS — Z886 Allergy status to analgesic agent status: Secondary | ICD-10-CM | POA: Diagnosis not present

## 2023-05-29 DIAGNOSIS — Z202 Contact with and (suspected) exposure to infections with a predominantly sexual mode of transmission: Secondary | ICD-10-CM | POA: Diagnosis not present

## 2023-05-29 DIAGNOSIS — S50862A Insect bite (nonvenomous) of left forearm, initial encounter: Secondary | ICD-10-CM | POA: Diagnosis not present

## 2023-05-29 DIAGNOSIS — A64 Unspecified sexually transmitted disease: Secondary | ICD-10-CM | POA: Diagnosis not present

## 2023-07-25 DIAGNOSIS — J101 Influenza due to other identified influenza virus with other respiratory manifestations: Secondary | ICD-10-CM | POA: Diagnosis not present

## 2023-07-25 DIAGNOSIS — R5383 Other fatigue: Secondary | ICD-10-CM | POA: Diagnosis not present

## 2023-07-25 DIAGNOSIS — J019 Acute sinusitis, unspecified: Secondary | ICD-10-CM | POA: Diagnosis not present

## 2023-07-25 DIAGNOSIS — U071 COVID-19: Secondary | ICD-10-CM | POA: Diagnosis not present

## 2023-09-29 DIAGNOSIS — Z419 Encounter for procedure for purposes other than remedying health state, unspecified: Secondary | ICD-10-CM | POA: Diagnosis not present

## 2023-10-29 DIAGNOSIS — Z419 Encounter for procedure for purposes other than remedying health state, unspecified: Secondary | ICD-10-CM | POA: Diagnosis not present

## 2023-11-29 DIAGNOSIS — Z419 Encounter for procedure for purposes other than remedying health state, unspecified: Secondary | ICD-10-CM | POA: Diagnosis not present

## 2023-12-28 DIAGNOSIS — M62830 Muscle spasm of back: Secondary | ICD-10-CM | POA: Diagnosis not present

## 2023-12-28 DIAGNOSIS — M549 Dorsalgia, unspecified: Secondary | ICD-10-CM | POA: Diagnosis not present

## 2023-12-30 DIAGNOSIS — Z419 Encounter for procedure for purposes other than remedying health state, unspecified: Secondary | ICD-10-CM | POA: Diagnosis not present

## 2024-01-02 DIAGNOSIS — L309 Dermatitis, unspecified: Secondary | ICD-10-CM | POA: Diagnosis not present

## 2024-01-02 DIAGNOSIS — B349 Viral infection, unspecified: Secondary | ICD-10-CM | POA: Diagnosis not present

## 2024-01-02 DIAGNOSIS — R197 Diarrhea, unspecified: Secondary | ICD-10-CM | POA: Diagnosis not present

## 2024-01-27 DIAGNOSIS — Z419 Encounter for procedure for purposes other than remedying health state, unspecified: Secondary | ICD-10-CM | POA: Diagnosis not present

## 2024-02-07 DIAGNOSIS — M79642 Pain in left hand: Secondary | ICD-10-CM | POA: Diagnosis not present

## 2024-02-07 DIAGNOSIS — M79645 Pain in left finger(s): Secondary | ICD-10-CM | POA: Diagnosis not present

## 2024-02-07 DIAGNOSIS — Z885 Allergy status to narcotic agent status: Secondary | ICD-10-CM | POA: Diagnosis not present

## 2024-02-07 DIAGNOSIS — Z886 Allergy status to analgesic agent status: Secondary | ICD-10-CM | POA: Diagnosis not present

## 2024-02-07 DIAGNOSIS — Z888 Allergy status to other drugs, medicaments and biological substances status: Secondary | ICD-10-CM | POA: Diagnosis not present

## 2024-03-09 DIAGNOSIS — Z419 Encounter for procedure for purposes other than remedying health state, unspecified: Secondary | ICD-10-CM | POA: Diagnosis not present

## 2024-03-20 DIAGNOSIS — Z886 Allergy status to analgesic agent status: Secondary | ICD-10-CM | POA: Diagnosis not present

## 2024-03-20 DIAGNOSIS — Z885 Allergy status to narcotic agent status: Secondary | ICD-10-CM | POA: Diagnosis not present

## 2024-03-20 DIAGNOSIS — M25561 Pain in right knee: Secondary | ICD-10-CM | POA: Diagnosis not present

## 2024-03-20 DIAGNOSIS — M25551 Pain in right hip: Secondary | ICD-10-CM | POA: Diagnosis not present

## 2024-03-20 DIAGNOSIS — Z888 Allergy status to other drugs, medicaments and biological substances status: Secondary | ICD-10-CM | POA: Diagnosis not present

## 2024-03-20 DIAGNOSIS — M5431 Sciatica, right side: Secondary | ICD-10-CM | POA: Diagnosis not present

## 2024-04-08 DIAGNOSIS — Z419 Encounter for procedure for purposes other than remedying health state, unspecified: Secondary | ICD-10-CM | POA: Diagnosis not present

## 2024-05-09 DIAGNOSIS — Z419 Encounter for procedure for purposes other than remedying health state, unspecified: Secondary | ICD-10-CM | POA: Diagnosis not present

## 2024-05-13 ENCOUNTER — Other Ambulatory Visit: Payer: Self-pay

## 2024-06-08 DIAGNOSIS — Z419 Encounter for procedure for purposes other than remedying health state, unspecified: Secondary | ICD-10-CM | POA: Diagnosis not present

## 2024-06-30 DIAGNOSIS — M479 Spondylosis, unspecified: Secondary | ICD-10-CM | POA: Diagnosis not present

## 2024-06-30 DIAGNOSIS — M62830 Muscle spasm of back: Secondary | ICD-10-CM | POA: Diagnosis not present

## 2024-06-30 DIAGNOSIS — R198 Other specified symptoms and signs involving the digestive system and abdomen: Secondary | ICD-10-CM | POA: Diagnosis not present

## 2024-07-09 DIAGNOSIS — Z419 Encounter for procedure for purposes other than remedying health state, unspecified: Secondary | ICD-10-CM | POA: Diagnosis not present

## 2024-07-15 ENCOUNTER — Other Ambulatory Visit: Payer: Self-pay

## 2024-07-18 DIAGNOSIS — M5416 Radiculopathy, lumbar region: Secondary | ICD-10-CM | POA: Diagnosis not present

## 2024-08-09 ENCOUNTER — Other Ambulatory Visit: Payer: Self-pay

## 2024-08-09 DIAGNOSIS — Z419 Encounter for procedure for purposes other than remedying health state, unspecified: Secondary | ICD-10-CM | POA: Diagnosis not present

## 2024-09-12 ENCOUNTER — Other Ambulatory Visit: Payer: Self-pay

## 2024-09-20 ENCOUNTER — Other Ambulatory Visit: Payer: Self-pay

## 2024-11-08 DIAGNOSIS — Z419 Encounter for procedure for purposes other than remedying health state, unspecified: Secondary | ICD-10-CM | POA: Diagnosis not present

## 2024-11-17 DIAGNOSIS — R079 Chest pain, unspecified: Secondary | ICD-10-CM | POA: Diagnosis not present
